# Patient Record
Sex: Female | Born: 1960 | Race: White | Hispanic: No | Smoking: Never smoker
Health system: Southern US, Community
[De-identification: ages and names within clinical notes are randomized; demographics above are authoritative.]

## PROBLEM LIST (undated history)

## (undated) DIAGNOSIS — F32A Depression, unspecified: Secondary | ICD-10-CM

## (undated) DIAGNOSIS — E611 Iron deficiency: Secondary | ICD-10-CM

## (undated) DIAGNOSIS — D649 Anemia, unspecified: Secondary | ICD-10-CM

## (undated) DIAGNOSIS — F329 Major depressive disorder, single episode, unspecified: Secondary | ICD-10-CM

## (undated) HISTORY — DX: Depression, unspecified: F32.A

## (undated) HISTORY — DX: Iron deficiency: E61.1

## (undated) HISTORY — DX: Anemia, unspecified: D64.9

## (undated) HISTORY — DX: Major depressive disorder, single episode, unspecified: F32.9

---
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
## 1995-10-30 HISTORY — PX: TUBAL LIGATION: SHX77

## 1998-12-15 ENCOUNTER — Encounter: Payer: Self-pay | Admitting: Gastroenterology

## 1998-12-15 ENCOUNTER — Ambulatory Visit (HOSPITAL_COMMUNITY): Admission: RE | Admit: 1998-12-15 | Discharge: 1998-12-15 | Payer: Self-pay | Admitting: Gastroenterology

## 1999-09-14 ENCOUNTER — Other Ambulatory Visit: Admission: RE | Admit: 1999-09-14 | Discharge: 1999-09-14 | Payer: Self-pay | Admitting: Obstetrics and Gynecology

## 2002-02-20 ENCOUNTER — Encounter: Admission: RE | Admit: 2002-02-20 | Discharge: 2002-02-20 | Payer: Self-pay | Admitting: Gynecology

## 2002-02-20 ENCOUNTER — Encounter: Payer: Self-pay | Admitting: Gynecology

## 2002-02-20 ENCOUNTER — Other Ambulatory Visit: Admission: RE | Admit: 2002-02-20 | Discharge: 2002-02-20 | Payer: Self-pay | Admitting: Gynecology

## 2002-04-07 ENCOUNTER — Encounter (HOSPITAL_COMMUNITY): Admission: RE | Admit: 2002-04-07 | Discharge: 2002-07-06 | Payer: Self-pay | Admitting: Oncology

## 2003-03-30 ENCOUNTER — Other Ambulatory Visit: Admission: RE | Admit: 2003-03-30 | Discharge: 2003-03-30 | Payer: Self-pay | Admitting: Gynecology

## 2004-11-09 ENCOUNTER — Other Ambulatory Visit: Admission: RE | Admit: 2004-11-09 | Discharge: 2004-11-09 | Payer: Self-pay | Admitting: Gynecology

## 2004-11-22 ENCOUNTER — Encounter: Admission: RE | Admit: 2004-11-22 | Discharge: 2004-11-22 | Payer: Self-pay | Admitting: Gynecology

## 2005-01-15 ENCOUNTER — Ambulatory Visit: Payer: Self-pay | Admitting: Oncology

## 2005-01-18 ENCOUNTER — Encounter (HOSPITAL_COMMUNITY): Admission: RE | Admit: 2005-01-18 | Discharge: 2005-04-18 | Payer: Self-pay | Admitting: Oncology

## 2005-04-27 ENCOUNTER — Ambulatory Visit: Payer: Self-pay | Admitting: Oncology

## 2005-04-30 ENCOUNTER — Encounter (HOSPITAL_COMMUNITY): Admission: RE | Admit: 2005-04-30 | Discharge: 2005-07-29 | Payer: Self-pay | Admitting: Oncology

## 2005-08-17 ENCOUNTER — Ambulatory Visit: Payer: Self-pay | Admitting: Oncology

## 2005-11-15 ENCOUNTER — Other Ambulatory Visit: Admission: RE | Admit: 2005-11-15 | Discharge: 2005-11-15 | Payer: Self-pay | Admitting: Gynecology

## 2005-12-27 ENCOUNTER — Ambulatory Visit: Payer: Self-pay | Admitting: Oncology

## 2005-12-28 ENCOUNTER — Encounter: Admission: RE | Admit: 2005-12-28 | Discharge: 2005-12-28 | Payer: Self-pay | Admitting: Gynecology

## 2006-02-11 ENCOUNTER — Encounter (HOSPITAL_COMMUNITY): Admission: RE | Admit: 2006-02-11 | Discharge: 2006-05-12 | Payer: Self-pay | Admitting: Oncology

## 2006-03-26 ENCOUNTER — Ambulatory Visit: Payer: Self-pay | Admitting: Oncology

## 2006-04-01 LAB — COMPREHENSIVE METABOLIC PANEL
AST: 13 U/L (ref 0–37)
BUN: 9 mg/dL (ref 6–23)
Calcium: 8.6 mg/dL (ref 8.4–10.5)
Chloride: 105 mEq/L (ref 96–112)
Creatinine, Ser: 0.89 mg/dL (ref 0.40–1.20)
Glucose, Bld: 80 mg/dL (ref 70–99)

## 2006-04-01 LAB — CBC & DIFF AND RETIC
Basophils Absolute: 0 10*3/uL (ref 0.0–0.1)
EOS%: 4 % (ref 0.0–7.0)
MCH: 29.6 pg (ref 26.0–34.0)
MCV: 86.3 fL (ref 81.0–101.0)
MONO%: 6.5 % (ref 0.0–13.0)
RBC: 4.57 10*6/uL (ref 3.70–5.32)
RDW: 17.6 % — ABNORMAL HIGH (ref 11.3–14.5)
RETIC #: 92.8 10*3/uL (ref 19.7–115.1)
Retic %: 2 % (ref 0.4–2.3)

## 2006-05-08 ENCOUNTER — Ambulatory Visit: Payer: Self-pay | Admitting: Oncology

## 2006-05-16 LAB — CBC & DIFF AND RETIC
BASO%: 0.6 % (ref 0.0–2.0)
EOS%: 3.1 % (ref 0.0–7.0)
MCH: 30.1 pg (ref 26.0–34.0)
MCHC: 34.2 g/dL (ref 32.0–36.0)
MCV: 87.9 fL (ref 81.0–101.0)
MONO%: 7.9 % (ref 0.0–13.0)
RBC: 4.6 10*6/uL (ref 3.70–5.32)
RDW: 14.6 % — ABNORMAL HIGH (ref 11.3–14.5)
RETIC #: 86 10*3/uL (ref 19.7–115.1)
Retic %: 1.9 % (ref 0.4–2.3)

## 2006-05-16 LAB — COMPREHENSIVE METABOLIC PANEL WITH GFR
ALT: 11 U/L (ref 0–40)
AST: 12 U/L (ref 0–37)
Albumin: 4.2 g/dL (ref 3.5–5.2)
Alkaline Phosphatase: 74 U/L (ref 39–117)
BUN: 9 mg/dL (ref 6–23)
CO2: 26 meq/L (ref 19–32)
Calcium: 8.9 mg/dL (ref 8.4–10.5)
Chloride: 102 meq/L (ref 96–112)
Creatinine, Ser: 0.75 mg/dL (ref 0.40–1.20)
Glucose, Bld: 82 mg/dL (ref 70–99)
Potassium: 4 meq/L (ref 3.5–5.3)
Sodium: 136 meq/L (ref 135–145)
Total Bilirubin: 0.4 mg/dL (ref 0.3–1.2)
Total Protein: 7.2 g/dL (ref 6.0–8.3)

## 2006-08-14 ENCOUNTER — Ambulatory Visit: Payer: Self-pay | Admitting: Oncology

## 2006-09-20 ENCOUNTER — Encounter (HOSPITAL_COMMUNITY): Admission: RE | Admit: 2006-09-20 | Discharge: 2006-09-20 | Payer: Self-pay | Admitting: Oncology

## 2006-11-06 ENCOUNTER — Ambulatory Visit: Payer: Self-pay | Admitting: Oncology

## 2006-11-11 LAB — CBC & DIFF AND RETIC
Basophils Absolute: 0 10*3/uL (ref 0.0–0.1)
Eosinophils Absolute: 0.2 10*3/uL (ref 0.0–0.5)
HGB: 13.2 g/dL (ref 11.6–15.9)
IRF: 0.28 (ref 0.130–0.330)
MCV: 86.1 fL (ref 81.0–101.0)
MONO#: 0.5 10*3/uL (ref 0.1–0.9)
MONO%: 8.1 % (ref 0.0–13.0)
NEUT#: 3.1 10*3/uL (ref 1.5–6.5)
RDW: 16.4 % — ABNORMAL HIGH (ref 11.3–14.5)
RETIC #: 77.9 10*3/uL (ref 19.7–115.1)
Retic %: 1.8 % (ref 0.4–2.3)
WBC: 5.8 10*3/uL (ref 3.9–10.0)
lymph#: 2 10*3/uL (ref 0.9–3.3)

## 2006-11-11 LAB — COMPREHENSIVE METABOLIC PANEL
ALT: 15 U/L (ref 0–35)
AST: 16 U/L (ref 0–37)
Albumin: 4 g/dL (ref 3.5–5.2)
Alkaline Phosphatase: 69 U/L (ref 39–117)
Calcium: 8.9 mg/dL (ref 8.4–10.5)
Chloride: 107 mEq/L (ref 96–112)
Potassium: 3.6 mEq/L (ref 3.5–5.3)
Sodium: 140 mEq/L (ref 135–145)
Total Protein: 6.5 g/dL (ref 6.0–8.3)

## 2007-01-14 ENCOUNTER — Encounter: Admission: RE | Admit: 2007-01-14 | Discharge: 2007-01-14 | Payer: Self-pay | Admitting: Family Medicine

## 2007-02-12 ENCOUNTER — Ambulatory Visit: Payer: Self-pay | Admitting: Oncology

## 2007-05-15 ENCOUNTER — Ambulatory Visit: Payer: Self-pay | Admitting: Oncology

## 2007-05-19 LAB — CBC WITH DIFFERENTIAL/PLATELET
BASO%: 0.4 % (ref 0.0–2.0)
Basophils Absolute: 0 10*3/uL (ref 0.0–0.1)
EOS%: 3.6 % (ref 0.0–7.0)
HCT: 33.3 % — ABNORMAL LOW (ref 34.8–46.6)
HGB: 11.5 g/dL — ABNORMAL LOW (ref 11.6–15.9)
LYMPH%: 31.4 % (ref 14.0–48.0)
MCH: 28 pg (ref 26.0–34.0)
MCHC: 34.4 g/dL (ref 32.0–36.0)
MCV: 81.4 fL (ref 81.0–101.0)
MONO%: 7.4 % (ref 0.0–13.0)
NEUT%: 57.2 % (ref 39.6–76.8)

## 2007-08-14 ENCOUNTER — Ambulatory Visit: Payer: Self-pay | Admitting: Oncology

## 2007-08-18 LAB — CBC WITH DIFFERENTIAL/PLATELET
BASO%: 0.5 % (ref 0.0–2.0)
Basophils Absolute: 0 10*3/uL (ref 0.0–0.1)
EOS%: 3.6 % (ref 0.0–7.0)
MCH: 25.8 pg — ABNORMAL LOW (ref 26.0–34.0)
MCHC: 33.9 g/dL (ref 32.0–36.0)
MCV: 76.2 fL — ABNORMAL LOW (ref 81.0–101.0)
MONO%: 7.5 % (ref 0.0–13.0)
RBC: 4.18 10*6/uL (ref 3.70–5.32)
RDW: 15.1 % — ABNORMAL HIGH (ref 11.3–14.5)

## 2007-11-13 ENCOUNTER — Ambulatory Visit: Payer: Self-pay | Admitting: Oncology

## 2007-11-17 LAB — CBC WITH DIFFERENTIAL/PLATELET
Eosinophils Absolute: 0.2 10*3/uL (ref 0.0–0.5)
MCV: 71.3 fL — ABNORMAL LOW (ref 81.0–101.0)
MONO%: 7.4 % (ref 0.0–13.0)
NEUT#: 2.4 10*3/uL (ref 1.5–6.5)
RBC: 4.21 10*6/uL (ref 3.70–5.32)
RDW: 16.2 % — ABNORMAL HIGH (ref 11.3–14.5)
WBC: 4.8 10*3/uL (ref 3.9–10.0)
lymph#: 1.8 10*3/uL (ref 0.9–3.3)

## 2007-11-24 LAB — FERRITIN: Ferritin: 3 ng/mL — ABNORMAL LOW (ref 10–291)

## 2008-02-23 ENCOUNTER — Ambulatory Visit: Payer: Self-pay | Admitting: Oncology

## 2008-08-12 ENCOUNTER — Ambulatory Visit: Payer: Self-pay | Admitting: Oncology

## 2008-11-11 ENCOUNTER — Ambulatory Visit: Payer: Self-pay | Admitting: Oncology

## 2009-04-26 ENCOUNTER — Ambulatory Visit: Payer: Self-pay | Admitting: Oncology

## 2009-04-28 ENCOUNTER — Ambulatory Visit: Payer: Self-pay | Admitting: Women's Health

## 2009-04-28 ENCOUNTER — Other Ambulatory Visit: Admission: RE | Admit: 2009-04-28 | Discharge: 2009-04-28 | Payer: Self-pay | Admitting: Obstetrics and Gynecology

## 2009-04-28 ENCOUNTER — Encounter: Payer: Self-pay | Admitting: Women's Health

## 2009-04-28 LAB — CBC WITH DIFFERENTIAL/PLATELET
BASO%: 0.3 % (ref 0.0–2.0)
EOS%: 6.1 % (ref 0.0–7.0)
HCT: 26.4 % — ABNORMAL LOW (ref 34.8–46.6)
LYMPH%: 35.3 % (ref 14.0–49.7)
MCH: 22 pg — ABNORMAL LOW (ref 25.1–34.0)
MCHC: 32.4 g/dL (ref 31.5–36.0)
NEUT%: 51.9 % (ref 38.4–76.8)
Platelets: 240 10*3/uL (ref 145–400)
RBC: 3.89 10*6/uL (ref 3.70–5.45)
lymph#: 2.1 10*3/uL (ref 0.9–3.3)

## 2009-06-13 ENCOUNTER — Ambulatory Visit: Payer: Self-pay | Admitting: Obstetrics and Gynecology

## 2009-06-20 ENCOUNTER — Ambulatory Visit: Payer: Self-pay | Admitting: Oncology

## 2009-06-22 LAB — CBC WITH DIFFERENTIAL/PLATELET
EOS%: 5 % (ref 0.0–7.0)
Eosinophils Absolute: 0.3 10*3/uL (ref 0.0–0.5)
LYMPH%: 35.3 % (ref 14.0–49.7)
MCH: 24.2 pg — ABNORMAL LOW (ref 25.1–34.0)
MCV: 75.1 fL — ABNORMAL LOW (ref 79.5–101.0)
MONO%: 7.3 % (ref 0.0–14.0)
NEUT#: 3 10*3/uL (ref 1.5–6.5)
Platelets: 290 10*3/uL (ref 145–400)
RBC: 3.57 10*6/uL — ABNORMAL LOW (ref 3.70–5.45)

## 2009-06-22 LAB — FERRITIN: Ferritin: 12 ng/mL (ref 10–291)

## 2009-07-25 ENCOUNTER — Ambulatory Visit: Payer: Self-pay | Admitting: Oncology

## 2009-08-15 LAB — CBC WITH DIFFERENTIAL/PLATELET
BASO%: 0.8 % (ref 0.0–2.0)
Basophils Absolute: 0 10*3/uL (ref 0.0–0.1)
EOS%: 4.7 % (ref 0.0–7.0)
HCT: 34 % — ABNORMAL LOW (ref 34.8–46.6)
HGB: 11.4 g/dL — ABNORMAL LOW (ref 11.6–15.9)
LYMPH%: 31 % (ref 14.0–49.7)
MCH: 26.3 pg (ref 25.1–34.0)
MCHC: 33.4 g/dL (ref 31.5–36.0)
MCV: 78.6 fL — ABNORMAL LOW (ref 79.5–101.0)
MONO%: 7.8 % (ref 0.0–14.0)
NEUT%: 55.7 % (ref 38.4–76.8)
Platelets: 261 10*3/uL (ref 145–400)
lymph#: 1.8 10*3/uL (ref 0.9–3.3)

## 2009-11-24 ENCOUNTER — Ambulatory Visit: Payer: Self-pay | Admitting: Oncology

## 2009-11-24 LAB — CBC WITH DIFFERENTIAL/PLATELET
Eosinophils Absolute: 0.4 10*3/uL (ref 0.0–0.5)
HCT: 34.5 % — ABNORMAL LOW (ref 34.8–46.6)
LYMPH%: 31.5 % (ref 14.0–49.7)
MCHC: 33.1 g/dL (ref 31.5–36.0)
MONO#: 0.6 10*3/uL (ref 0.1–0.9)
NEUT#: 4.1 10*3/uL (ref 1.5–6.5)
NEUT%: 55.1 % (ref 38.4–76.8)
Platelets: 240 10*3/uL (ref 145–400)
WBC: 7.4 10*3/uL (ref 3.9–10.3)

## 2010-02-22 ENCOUNTER — Ambulatory Visit: Payer: Self-pay | Admitting: Oncology

## 2010-02-22 LAB — CBC WITH DIFFERENTIAL/PLATELET
BASO%: 0.4 % (ref 0.0–2.0)
Basophils Absolute: 0 10*3/uL (ref 0.0–0.1)
EOS%: 6.3 % (ref 0.0–7.0)
Eosinophils Absolute: 0.4 10*3/uL (ref 0.0–0.5)
HCT: 36.9 % (ref 34.8–46.6)
HGB: 12.3 g/dL (ref 11.6–15.9)
LYMPH%: 36.5 % (ref 14.0–49.7)
MCH: 28.3 pg (ref 25.1–34.0)
MCHC: 33.3 g/dL (ref 31.5–36.0)
MCV: 84.8 fL (ref 79.5–101.0)
MONO#: 0.3 10*3/uL (ref 0.1–0.9)
MONO%: 4.9 % (ref 0.0–14.0)
NEUT#: 3.5 10*3/uL (ref 1.5–6.5)
NEUT%: 51.9 % (ref 38.4–76.8)
Platelets: 216 10*3/uL (ref 145–400)
RBC: 4.35 10*6/uL (ref 3.70–5.45)
RDW: 17 % — ABNORMAL HIGH (ref 11.2–14.5)
WBC: 6.8 10*3/uL (ref 3.9–10.3)
lymph#: 2.5 10*3/uL (ref 0.9–3.3)

## 2010-02-22 LAB — FERRITIN: Ferritin: 23 ng/mL (ref 10–291)

## 2010-05-10 ENCOUNTER — Ambulatory Visit: Payer: Self-pay | Admitting: Obstetrics and Gynecology

## 2010-05-15 ENCOUNTER — Ambulatory Visit: Payer: Self-pay | Admitting: Oncology

## 2010-05-18 ENCOUNTER — Encounter: Admission: RE | Admit: 2010-05-18 | Discharge: 2010-05-18 | Payer: Self-pay | Admitting: Obstetrics and Gynecology

## 2010-05-22 LAB — CBC WITH DIFFERENTIAL/PLATELET
Eosinophils Absolute: 0.3 10*3/uL (ref 0.0–0.5)
LYMPH%: 32.7 % (ref 14.0–49.7)
MCHC: 33.3 g/dL (ref 31.5–36.0)
MCV: 82.2 fL (ref 79.5–101.0)
MONO%: 8 % (ref 0.0–14.0)
NEUT%: 54.7 % (ref 38.4–76.8)
Platelets: 282 10*3/uL (ref 145–400)
RBC: 4.15 10*6/uL (ref 3.70–5.45)

## 2010-05-22 LAB — FERRITIN: Ferritin: 7 ng/mL — ABNORMAL LOW (ref 10–291)

## 2010-06-22 ENCOUNTER — Ambulatory Visit: Payer: Self-pay | Admitting: Oncology

## 2010-07-18 LAB — CBC WITH DIFFERENTIAL/PLATELET
Basophils Absolute: 0 10*3/uL (ref 0.0–0.1)
EOS%: 5.7 % (ref 0.0–7.0)
HCT: 34.8 % (ref 34.8–46.6)
HGB: 11.7 g/dL (ref 11.6–15.9)
MCH: 27.7 pg (ref 25.1–34.0)
MCV: 82.5 fL (ref 79.5–101.0)
MONO%: 7.4 % (ref 0.0–14.0)
NEUT%: 48.6 % (ref 38.4–76.8)
Platelets: 236 10*3/uL (ref 145–400)

## 2010-08-03 ENCOUNTER — Other Ambulatory Visit: Admission: RE | Admit: 2010-08-03 | Discharge: 2010-08-03 | Payer: Self-pay | Admitting: Obstetrics and Gynecology

## 2010-08-03 ENCOUNTER — Ambulatory Visit: Payer: Self-pay | Admitting: Women's Health

## 2010-09-14 ENCOUNTER — Ambulatory Visit: Payer: Self-pay | Admitting: Women's Health

## 2010-11-20 ENCOUNTER — Encounter: Payer: Self-pay | Admitting: Gynecology

## 2011-01-15 ENCOUNTER — Other Ambulatory Visit (HOSPITAL_COMMUNITY): Payer: Self-pay | Admitting: Orthopedic Surgery

## 2011-01-15 DIAGNOSIS — M25562 Pain in left knee: Secondary | ICD-10-CM

## 2011-01-17 ENCOUNTER — Other Ambulatory Visit: Payer: Self-pay | Admitting: Oncology

## 2011-01-17 ENCOUNTER — Encounter (HOSPITAL_BASED_OUTPATIENT_CLINIC_OR_DEPARTMENT_OTHER): Payer: 59 | Admitting: Oncology

## 2011-01-17 DIAGNOSIS — N920 Excessive and frequent menstruation with regular cycle: Secondary | ICD-10-CM

## 2011-01-17 DIAGNOSIS — D500 Iron deficiency anemia secondary to blood loss (chronic): Secondary | ICD-10-CM

## 2011-01-17 DIAGNOSIS — D509 Iron deficiency anemia, unspecified: Secondary | ICD-10-CM

## 2011-01-17 LAB — CBC WITH DIFFERENTIAL/PLATELET
BASO%: 0.3 % (ref 0.0–2.0)
EOS%: 8.3 % — ABNORMAL HIGH (ref 0.0–7.0)
LYMPH%: 36.3 % (ref 14.0–49.7)
MCH: 29.2 pg (ref 25.1–34.0)
MCHC: 34.2 g/dL (ref 31.5–36.0)
MCV: 85.3 fL (ref 79.5–101.0)
MONO%: 8 % (ref 0.0–14.0)
NEUT#: 2.8 10*3/uL (ref 1.5–6.5)
Platelets: 222 10*3/uL (ref 145–400)
RBC: 4.27 10*6/uL (ref 3.70–5.45)
RDW: 14 % (ref 11.2–14.5)

## 2011-01-17 LAB — FERRITIN: Ferritin: 9 ng/mL — ABNORMAL LOW (ref 10–291)

## 2011-01-18 ENCOUNTER — Ambulatory Visit (HOSPITAL_COMMUNITY)
Admission: RE | Admit: 2011-01-18 | Discharge: 2011-01-18 | Disposition: A | Discharge status: Home / Self Care | Payer: 59 | Source: Ambulatory Visit | Attending: Orthopedic Surgery | Admitting: Orthopedic Surgery

## 2011-01-18 DIAGNOSIS — M2240 Chondromalacia patellae, unspecified knee: Secondary | ICD-10-CM | POA: Insufficient documentation

## 2011-01-18 DIAGNOSIS — M25569 Pain in unspecified knee: Secondary | ICD-10-CM | POA: Insufficient documentation

## 2011-01-18 DIAGNOSIS — M2390 Unspecified internal derangement of unspecified knee: Secondary | ICD-10-CM | POA: Insufficient documentation

## 2011-01-18 DIAGNOSIS — M9420 Chondromalacia, unspecified site: Secondary | ICD-10-CM | POA: Insufficient documentation

## 2011-01-18 DIAGNOSIS — M25469 Effusion, unspecified knee: Secondary | ICD-10-CM | POA: Insufficient documentation

## 2011-01-19 ENCOUNTER — Inpatient Hospital Stay (HOSPITAL_COMMUNITY): Admission: RE | Admit: 2011-01-19 | Payer: Self-pay | Source: Ambulatory Visit

## 2011-01-25 ENCOUNTER — Encounter (HOSPITAL_BASED_OUTPATIENT_CLINIC_OR_DEPARTMENT_OTHER): Payer: 59 | Admitting: Oncology

## 2011-01-25 DIAGNOSIS — D500 Iron deficiency anemia secondary to blood loss (chronic): Secondary | ICD-10-CM

## 2011-01-25 DIAGNOSIS — N920 Excessive and frequent menstruation with regular cycle: Secondary | ICD-10-CM

## 2011-02-01 ENCOUNTER — Encounter (HOSPITAL_BASED_OUTPATIENT_CLINIC_OR_DEPARTMENT_OTHER): Payer: 59 | Admitting: Oncology

## 2011-02-01 DIAGNOSIS — D500 Iron deficiency anemia secondary to blood loss (chronic): Secondary | ICD-10-CM

## 2011-02-01 DIAGNOSIS — N920 Excessive and frequent menstruation with regular cycle: Secondary | ICD-10-CM

## 2011-02-21 ENCOUNTER — Encounter (HOSPITAL_BASED_OUTPATIENT_CLINIC_OR_DEPARTMENT_OTHER): Payer: 59 | Admitting: Oncology

## 2011-02-21 ENCOUNTER — Other Ambulatory Visit: Payer: Self-pay | Admitting: Oncology

## 2011-02-21 DIAGNOSIS — D500 Iron deficiency anemia secondary to blood loss (chronic): Secondary | ICD-10-CM

## 2011-02-21 DIAGNOSIS — D509 Iron deficiency anemia, unspecified: Secondary | ICD-10-CM

## 2011-02-21 DIAGNOSIS — N920 Excessive and frequent menstruation with regular cycle: Secondary | ICD-10-CM

## 2011-02-21 LAB — CBC & DIFF AND RETIC
BASO%: 0.3 % (ref 0.0–2.0)
EOS%: 4.7 % (ref 0.0–7.0)
Immature Retic Fract: 6.4 % (ref 0.00–10.70)
MCH: 29 pg (ref 25.1–34.0)
MCHC: 33.2 g/dL (ref 31.5–36.0)
MONO#: 0.4 10*3/uL (ref 0.1–0.9)
NEUT%: 53 % (ref 38.4–76.8)
RBC: 4.51 10*6/uL (ref 3.70–5.45)
Retic %: 2.17 % — ABNORMAL HIGH (ref 0.50–1.50)
WBC: 5.7 10*3/uL (ref 3.9–10.3)
lymph#: 2 10*3/uL (ref 0.9–3.3)

## 2011-02-21 LAB — FERRITIN: Ferritin: 173 ng/mL (ref 10–291)

## 2011-04-20 ENCOUNTER — Other Ambulatory Visit: Payer: Self-pay | Admitting: Obstetrics and Gynecology

## 2011-04-20 DIAGNOSIS — Z1231 Encounter for screening mammogram for malignant neoplasm of breast: Secondary | ICD-10-CM

## 2011-04-25 ENCOUNTER — Ambulatory Visit (HOSPITAL_BASED_OUTPATIENT_CLINIC_OR_DEPARTMENT_OTHER)
Admission: RE | Admit: 2011-04-25 | Discharge: 2011-04-25 | Disposition: A | Discharge status: Home / Self Care | Payer: 59 | Source: Ambulatory Visit | Attending: Orthopedic Surgery | Admitting: Orthopedic Surgery

## 2011-04-25 DIAGNOSIS — IMO0002 Reserved for concepts with insufficient information to code with codable children: Secondary | ICD-10-CM | POA: Insufficient documentation

## 2011-04-25 DIAGNOSIS — Z01812 Encounter for preprocedural laboratory examination: Secondary | ICD-10-CM | POA: Insufficient documentation

## 2011-04-25 DIAGNOSIS — Z79899 Other long term (current) drug therapy: Secondary | ICD-10-CM | POA: Insufficient documentation

## 2011-04-25 DIAGNOSIS — X58XXXA Exposure to other specified factors, initial encounter: Secondary | ICD-10-CM | POA: Insufficient documentation

## 2011-04-25 DIAGNOSIS — E669 Obesity, unspecified: Secondary | ICD-10-CM | POA: Insufficient documentation

## 2011-04-25 DIAGNOSIS — M9420 Chondromalacia, unspecified site: Secondary | ICD-10-CM | POA: Insufficient documentation

## 2011-04-25 DIAGNOSIS — D509 Iron deficiency anemia, unspecified: Secondary | ICD-10-CM | POA: Insufficient documentation

## 2011-04-25 LAB — CBC
HCT: 40.7 % (ref 36.0–46.0)
Hemoglobin: 13.6 g/dL (ref 12.0–15.0)
MCH: 29.1 pg (ref 26.0–34.0)
MCHC: 33.4 g/dL (ref 30.0–36.0)
RDW: 13.7 % (ref 11.5–15.5)

## 2011-04-27 HISTORY — PX: KNEE ARTHROSCOPY: SHX127

## 2011-05-09 NOTE — Op Note (Addendum)
NAMEMIYEKO, MAHLUM             ACCOUNT NO.:  [id number]  MEDICAL RECORD NO.:  [id number]  LOCATION:  MRI                          FACILITY:  [facility]  PHYSICIAN:  Ollen Gross, M.D.    DATE OF BIRTH:  [birth year]  DATE OF PROCEDURE:  04/25/2011 DATE OF DISCHARGE:  01/18/2011                              OPERATIVE REPORT   PREOPERATIVE DIAGNOSIS:  Left knee medial meniscal tear.  POSTOPERATIVE DIAGNOSES:  Left knee medial meniscal tear, plus osteochondral defect of medial femoral condyle.  PROCEDURE:  Left knee arthroscopy with meniscal debridement and chondroplasty.  SURGEON:  Ollen Gross, M.D.  ASSISTANT.:  None.  ANESTHESIA:  General.  ESTIMATED BLOOD LOSS:  Minimal.  DRAINS:  None.  COMPLICATIONS:  None.  CONDITION.:  Stable to recovery.  BRIEF CLINICAL NOTE:  Tara Duffy is a 50 year old female who has had a several-month history of significant left knee pain, recurrent effusions and mechanical symptoms.  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  Exam and history suggested medial meniscal tear which was suggested by MRI.  She presents now for arthroscopy and debridement.  PROCEDURE IN DETAIL:  After successful administration of general anesthetic, a tourniquet was placed on her left thigh and left lower extremity prepped and draped in the usual sterile fashion.  Standard superomedial and inferolateral incisions were made, inflow cannula passed, superomedial camera passed inferolateral.  Arthroscopic visualization proceeds.  Undersurface of patella has exposed bone as does the trochlea, especially laterally.  Medial and lateral gutters were visualized, there were no loose bodies.  Flexion valgus force was applied to the knee and the medial compartment was entered.  There was about 1 x 2 cm osteochondral defect in the medial femoral condyle at the central most portion of it.  This space is unstable.   There is also a medial meniscal tear in the posterior horn.  A spinal needle was used to localize the inferomedial portal, a small incision made and dilator placed.  The meniscus was then debrided back to stable base with baskets and 4.2 mm shaver and sealed off with the ArthroCare.  I debrided the osteochondral defect with the shaver.  I debrided back to bone and abraded the bone.  That actual defect was less than 1 x 1 cm but the overall chondral defect was about another 1 x 2 cm.  The rest of medial femoral condyle looked fairly normal.  The medial tibial plateau was normal.  Intercondylar notch was visualized.  The ACL was intact. Lateral compartment was entered and it looked normal.  Patellofemoral compartment was again addressed revealing the exposed bone.  There was a little unstable cartilage in the trochlea which I debrided back to stable base.  Joints again inspected and no other tears, loose bodies or defects were noted.  The arthroscopic equipment were removed from inferior portals which were closed with interrupted 4-0 nylon.  A 20 cc of 0.25% Marcaine with epi injected through the inflow cannula and that was removed and that portal closed with nylon.  Incision were cleaned and dried and a bulky sterile dressing applied.  She was then awakened and transported to recovery room  in stable condition.     Ollen Gross, M.D.     FA/MEDQ  D:  04/25/2011  T:  04/25/2011  Job:  [id number]  Electronically Signed by Ollen Gross M.D. on 05/09/2011 12:31:48 PM

## 2011-05-10 ENCOUNTER — Other Ambulatory Visit: Payer: Self-pay | Admitting: Oncology

## 2011-05-10 ENCOUNTER — Encounter (HOSPITAL_BASED_OUTPATIENT_CLINIC_OR_DEPARTMENT_OTHER): Payer: 59 | Admitting: Oncology

## 2011-05-10 DIAGNOSIS — N920 Excessive and frequent menstruation with regular cycle: Secondary | ICD-10-CM

## 2011-05-10 DIAGNOSIS — D509 Iron deficiency anemia, unspecified: Secondary | ICD-10-CM

## 2011-05-10 DIAGNOSIS — N6310 Unspecified lump in the right breast, unspecified quadrant: Secondary | ICD-10-CM

## 2011-05-10 DIAGNOSIS — D500 Iron deficiency anemia secondary to blood loss (chronic): Secondary | ICD-10-CM

## 2011-05-10 LAB — CBC & DIFF AND RETIC
BASO%: 0.4 % (ref 0.0–2.0)
EOS%: 5.2 % (ref 0.0–7.0)
LYMPH%: 40.3 % (ref 14.0–49.7)
MCHC: 34.3 g/dL (ref 31.5–36.0)
MCV: 86.6 fL (ref 79.5–101.0)
MONO%: 6.7 % (ref 0.0–14.0)
NEUT#: 3.2 10*3/uL (ref 1.5–6.5)
Platelets: 230 10*3/uL (ref 145–400)
RBC: 4.04 10*6/uL (ref 3.70–5.45)
RDW: 13.4 % (ref 11.2–14.5)
Retic %: 1.28 % (ref 0.50–1.50)
nRBC: 0 % (ref 0–0)

## 2011-05-21 ENCOUNTER — Ambulatory Visit
Admission: RE | Admit: 2011-05-21 | Discharge: 2011-05-21 | Disposition: A | Discharge status: Home / Self Care | Payer: 59 | Source: Ambulatory Visit | Attending: Oncology | Admitting: Oncology

## 2011-05-21 ENCOUNTER — Ambulatory Visit: Payer: 59

## 2011-05-21 DIAGNOSIS — N6310 Unspecified lump in the right breast, unspecified quadrant: Secondary | ICD-10-CM

## 2011-05-22 ENCOUNTER — Telehealth: Payer: Self-pay | Admitting: Women's Health

## 2011-05-23 NOTE — Telephone Encounter (Signed)
Telephone call to [facility] left  message on her cell per her request. Call was made in regard to her mammogram report. Reviewed her mammogram report from 7-11 2 her current mammogram report. Did review it appears stable. Encouraged to continue SBE report any changes.

## 2011-06-28 ENCOUNTER — Encounter (HOSPITAL_BASED_OUTPATIENT_CLINIC_OR_DEPARTMENT_OTHER): Payer: 59 | Admitting: Oncology

## 2011-06-28 ENCOUNTER — Other Ambulatory Visit: Payer: Self-pay | Admitting: Oncology

## 2011-06-28 DIAGNOSIS — D509 Iron deficiency anemia, unspecified: Secondary | ICD-10-CM

## 2011-06-28 DIAGNOSIS — N920 Excessive and frequent menstruation with regular cycle: Secondary | ICD-10-CM

## 2011-06-28 DIAGNOSIS — D500 Iron deficiency anemia secondary to blood loss (chronic): Secondary | ICD-10-CM

## 2011-06-28 LAB — CBC & DIFF AND RETIC
Basophils Absolute: 0 10*3/uL (ref 0.0–0.1)
EOS%: 4.6 % (ref 0.0–7.0)
Eosinophils Absolute: 0.3 10*3/uL (ref 0.0–0.5)
HGB: 12.3 g/dL (ref 11.6–15.9)
Immature Retic Fract: 12.1 % — ABNORMAL HIGH (ref 1.60–10.00)
MCH: 28.7 pg (ref 25.1–34.0)
NEUT#: 3.1 10*3/uL (ref 1.5–6.5)
RDW: 13.3 % (ref 11.2–14.5)
Retic Ct Abs: 82.8 10*3/uL (ref 33.70–90.70)
lymph#: 2.5 10*3/uL (ref 0.9–3.3)

## 2011-07-11 ENCOUNTER — Encounter (HOSPITAL_BASED_OUTPATIENT_CLINIC_OR_DEPARTMENT_OTHER): Payer: 59 | Admitting: Oncology

## 2011-07-11 DIAGNOSIS — D509 Iron deficiency anemia, unspecified: Secondary | ICD-10-CM

## 2011-07-11 DIAGNOSIS — N920 Excessive and frequent menstruation with regular cycle: Secondary | ICD-10-CM

## 2011-07-19 ENCOUNTER — Encounter (HOSPITAL_BASED_OUTPATIENT_CLINIC_OR_DEPARTMENT_OTHER): Payer: 59 | Admitting: Oncology

## 2011-07-19 DIAGNOSIS — D509 Iron deficiency anemia, unspecified: Secondary | ICD-10-CM

## 2011-07-19 DIAGNOSIS — N920 Excessive and frequent menstruation with regular cycle: Secondary | ICD-10-CM

## 2011-08-09 DIAGNOSIS — F329 Major depressive disorder, single episode, unspecified: Secondary | ICD-10-CM | POA: Insufficient documentation

## 2011-08-09 DIAGNOSIS — D649 Anemia, unspecified: Secondary | ICD-10-CM | POA: Insufficient documentation

## 2011-08-09 DIAGNOSIS — F32A Depression, unspecified: Secondary | ICD-10-CM | POA: Insufficient documentation

## 2011-08-09 DIAGNOSIS — K829 Disease of gallbladder, unspecified: Secondary | ICD-10-CM | POA: Insufficient documentation

## 2011-08-10 ENCOUNTER — Other Ambulatory Visit: Payer: Self-pay | Admitting: Women's Health

## 2011-08-15 ENCOUNTER — Encounter: Payer: Self-pay | Admitting: Women's Health

## 2011-08-15 ENCOUNTER — Encounter (HOSPITAL_BASED_OUTPATIENT_CLINIC_OR_DEPARTMENT_OTHER): Payer: 59 | Admitting: Oncology

## 2011-08-15 ENCOUNTER — Ambulatory Visit (INDEPENDENT_AMBULATORY_CARE_PROVIDER_SITE_OTHER): Payer: 59 | Admitting: Women's Health

## 2011-08-15 ENCOUNTER — Other Ambulatory Visit: Payer: Self-pay | Admitting: Oncology

## 2011-08-15 ENCOUNTER — Other Ambulatory Visit (HOSPITAL_COMMUNITY)
Admission: RE | Admit: 2011-08-15 | Discharge: 2011-08-15 | Disposition: A | Discharge status: Home / Self Care | Payer: 59 | Source: Ambulatory Visit | Attending: Women's Health | Admitting: Women's Health

## 2011-08-15 VITALS — BP 130/70 | Ht 67.5 in | Wt 263.0 lb

## 2011-08-15 DIAGNOSIS — N920 Excessive and frequent menstruation with regular cycle: Secondary | ICD-10-CM

## 2011-08-15 DIAGNOSIS — Z01419 Encounter for gynecological examination (general) (routine) without abnormal findings: Secondary | ICD-10-CM

## 2011-08-15 DIAGNOSIS — D509 Iron deficiency anemia, unspecified: Secondary | ICD-10-CM

## 2011-08-15 DIAGNOSIS — F419 Anxiety disorder, unspecified: Secondary | ICD-10-CM

## 2011-08-15 DIAGNOSIS — D500 Iron deficiency anemia secondary to blood loss (chronic): Secondary | ICD-10-CM

## 2011-08-15 DIAGNOSIS — F411 Generalized anxiety disorder: Secondary | ICD-10-CM

## 2011-08-15 LAB — CBC & DIFF AND RETIC
BASO%: 0.2 % (ref 0.0–2.0)
Basophils Absolute: 0 10*3/uL (ref 0.0–0.1)
EOS%: 7.2 % — ABNORMAL HIGH (ref 0.0–7.0)
HGB: 13.8 g/dL (ref 11.6–15.9)
Immature Retic Fract: 5.6 % (ref 1.60–10.00)
MCH: 29.2 pg (ref 25.1–34.0)
MCHC: 34.1 g/dL (ref 31.5–36.0)
RDW: 14.6 % — ABNORMAL HIGH (ref 11.2–14.5)
Retic %: 1.82 % (ref 0.70–2.10)
Retic Ct Abs: 86.09 10*3/uL (ref 33.70–90.70)
lymph#: 2.7 10*3/uL (ref 0.9–3.3)

## 2011-08-15 MED ORDER — SERTRALINE HCL 100 MG PO TABS: 100.0000 mg | ORAL_TABLET | Freq: Every day | ORAL | Status: DC
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
## 2011-10-03 ENCOUNTER — Telehealth: Payer: Self-pay | Admitting: Oncology

## 2011-10-03 NOTE — Telephone Encounter (Signed)
per pof 05/10/2011 called pts home lmovm with appts for jan-july2013.  asked pt to rtn call to confirm appts

## 2011-11-07 ENCOUNTER — Telehealth: Payer: Self-pay | Admitting: *Deleted

## 2011-11-07 NOTE — Telephone Encounter (Signed)
Pt called c/o period since christmas day, she said heavy bleeding since 28 th and still bleeding heavy. She is using a tampon and pad. On Monday she started feeling very tired,nauesa. Pt has Friday off from work if she needs office visit. Please advise

## 2011-11-07 NOTE — Telephone Encounter (Signed)
Please have her schedule office visit For Friday a.m.

## 2011-11-07 NOTE — Telephone Encounter (Signed)
Please see below note

## 2011-11-07 NOTE — Telephone Encounter (Signed)
Pt informed to make appointment.  

## 2011-11-09 ENCOUNTER — Ambulatory Visit (INDEPENDENT_AMBULATORY_CARE_PROVIDER_SITE_OTHER): Payer: 59 | Admitting: Women's Health

## 2011-11-09 ENCOUNTER — Encounter: Payer: Self-pay | Admitting: Women's Health

## 2011-11-09 DIAGNOSIS — N938 Other specified abnormal uterine and vaginal bleeding: Secondary | ICD-10-CM

## 2011-11-09 DIAGNOSIS — N949 Unspecified condition associated with female genital organs and menstrual cycle: Secondary | ICD-10-CM

## 2011-11-09 MED ORDER — MEGESTROL ACETATE 40 MG PO TABS: 40.0000 mg | ORAL_TABLET | Freq: Every day | ORAL | Status: AC

## 2011-11-09 NOTE — Progress Notes (Signed)
Patient ID: Tara Duffy, female   DOB: December 17, 1960, 51 y.o.   MRN: [id number] Presents with a complaint of heavy menstrual bleeding. Cycle started on 12/25, heavy flow with clots using a pad and tampon every 1-2 hours. History of iron deficiency anemia where she sees Dr. Darnelle Catalan, has iron infusions, have scheduled appointment next week. History of BTL and has not been sexually active in years. Prior to this cycle, cycles were every 1-2 months for 4-5 days with menorrhagia. States also having some cramping. Denies fever, urinary symptoms, or discharge.  Exam: External genitalia is within normal limits, speculum exam copious menses type blood, cervix pink healthy without lesion. Bimanual no CMT or adnexal fullness or tenderness.  DUB  Plan: TSH and prolactin. Keep scheduled appointment with Dr.Magrinat next week. Megace 40 mg by mouth twice a day for at least 10 days or until bleeding stops. Instructed to call if bleeding continues greater than one week with Megace. Schedule sonohysterogram in 2 weeks with Dr. Eda Paschal. Endometrial ablation discussed.

## 2011-11-20 ENCOUNTER — Other Ambulatory Visit: Payer: 59 | Admitting: Lab

## 2011-11-20 ENCOUNTER — Telehealth: Payer: Self-pay | Admitting: *Deleted

## 2011-11-20 LAB — CBC & DIFF AND RETIC
BASO%: 0.3 % (ref 0.0–2.0)
LYMPH%: 36.3 % (ref 14.0–49.7)
MCH: 28.8 pg (ref 25.1–34.0)
MCHC: 33.5 g/dL (ref 31.5–36.0)
MCV: 86 fL (ref 79.5–101.0)
MONO%: 4.2 % (ref 0.0–14.0)
Platelets: 253 10*3/uL (ref 145–400)
RBC: 3.99 10*6/uL (ref 3.70–5.45)
Retic %: 2.4 % — ABNORMAL HIGH (ref 0.70–2.10)

## 2011-11-20 NOTE — Telephone Encounter (Signed)
Pt was seen on 11/09/11 for DUB she was giving megace 40 mg  mouth daily. Pt was told to follow up if bleeding continues, pt said she is still bleeding. Please advise

## 2011-11-21 NOTE — Telephone Encounter (Signed)
Telephone call states started bleeding heavy today, took Megace 40 twice today. (had been taking once daily after bleeding decreased)   Will continue Megace until sonohysterogram appointment with Dr. Lily Peer next week.

## 2011-11-21 NOTE — Telephone Encounter (Signed)
Message left on cell phone to call the office in regards to message.

## 2011-11-26 ENCOUNTER — Other Ambulatory Visit: Payer: Self-pay | Admitting: Oncology

## 2011-11-27 ENCOUNTER — Other Ambulatory Visit: Payer: Self-pay | Admitting: Gynecology

## 2011-11-27 ENCOUNTER — Telehealth: Payer: Self-pay | Admitting: *Deleted

## 2011-11-27 DIAGNOSIS — N938 Other specified abnormal uterine and vaginal bleeding: Secondary | ICD-10-CM

## 2011-11-27 NOTE — Telephone Encounter (Signed)
see 11/20/11 telephone encounter) Pt has [facility] scheduled tomorrow and said that she is still having bleeding, not as heavy as before. Pt would like to know if she should reschedule? Please advise  Patient to keep her appointment tomorrow for sonohysterogram as previously scheduled.

## 2011-11-27 NOTE — Telephone Encounter (Signed)
Left message on pt vm to keep appointment.

## 2011-11-27 NOTE — Telephone Encounter (Signed)
(  see 11/20/11 telephone encounter) Pt has [facility] scheduled tomorrow and said that she is still having bleeding, not as heavy as before. Pt would like to know if she should reschedule? Please advise

## 2011-11-28 ENCOUNTER — Ambulatory Visit (INDEPENDENT_AMBULATORY_CARE_PROVIDER_SITE_OTHER): Payer: 59

## 2011-11-28 ENCOUNTER — Ambulatory Visit (INDEPENDENT_AMBULATORY_CARE_PROVIDER_SITE_OTHER): Payer: 59 | Admitting: Gynecology

## 2011-11-28 ENCOUNTER — Encounter: Payer: Self-pay | Admitting: Gynecology

## 2011-11-28 ENCOUNTER — Telehealth: Payer: Self-pay

## 2011-11-28 ENCOUNTER — Other Ambulatory Visit: Payer: 59

## 2011-11-28 ENCOUNTER — Ambulatory Visit: Payer: 59 | Admitting: Gynecology

## 2011-11-28 DIAGNOSIS — D251 Intramural leiomyoma of uterus: Secondary | ICD-10-CM

## 2011-11-28 DIAGNOSIS — N938 Other specified abnormal uterine and vaginal bleeding: Secondary | ICD-10-CM

## 2011-11-28 DIAGNOSIS — N949 Unspecified condition associated with female genital organs and menstrual cycle: Secondary | ICD-10-CM

## 2011-11-28 DIAGNOSIS — D259 Leiomyoma of uterus, unspecified: Secondary | ICD-10-CM

## 2011-11-28 DIAGNOSIS — N840 Polyp of corpus uteri: Secondary | ICD-10-CM

## 2011-11-28 DIAGNOSIS — D649 Anemia, unspecified: Secondary | ICD-10-CM

## 2011-11-28 DIAGNOSIS — N852 Hypertrophy of uterus: Secondary | ICD-10-CM

## 2011-11-28 DIAGNOSIS — N920 Excessive and frequent menstruation with regular cycle: Secondary | ICD-10-CM

## 2011-11-28 NOTE — Progress Notes (Signed)
Tara Duffy is an 51 y.o. female. Who had presented today to the office for preoperative history and physical as a result of findings during sonohysterogram today. Patient with history of dysfunction uterine bleeding and iron deficiency anemia. Patient has been followed by the hematologist oncologist (Dr. Darnelle Catalan) who has given her iron infusions. Her last hemoglobin on January 22 was 11.5. Today's sonohysterogram demonstrated the following:  Uterus measured 10.7 x 8.5 x 6.3 cm endometrial stripe 14.1 mm. Uterus is anteverted. 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 Both right and left ovaries were normal.  Pertinent Gynecological History: Menses: Patient had been bleeding since December 25, heavy flow with passage of large clots and changing tampons every 1-2 hours. Also complaining of cramping as well. Bleeding: As above Contraception: tubal ligation DES exposure: denies Blood transfusions: none Sexually transmitted diseases: no past history Previous GYN Procedures: 3 C-sections and one tubal ligation.  Last mammogram: normal Date: 2012 Last pap: normal Date: 2012 OB History: G 5, P 5 (set of twins)   Menstrual History: Menarche age: 65 Patient's last menstrual period was 10/23/2011.    Past Medical History  Diagnosis Date  . Anemia     HISTORY OF  . Gallbladder disease     "GALL BLADDER DYSFUNCTION"  . Depression     Past Surgical History  Procedure Date  . Cesarean section '83,'95,'97    WITH BTL  . Pylonidal cyst 1981  . Knee arthroscopy 04/27/2011    left   . Tubal ligation 1997    Family History  Problem Relation Age of Onset  . Stroke Mother   . Hypertension Father   . Cancer Father     PROSTATE  . Diabetes Brother     Social History:  reports that she has never smoked. She has never used  smokeless tobacco. She reports that she drinks alcohol. She reports that she does not use illicit drugs.  Allergies: No Known Allergies   (Not in a hospital admission)  @ROS @  Last menstrual period 10/23/2011.  Physical Exam:  HEENT:unremarkable Neck:Supple, midline, no thyroid megaly, no carotid bruits Lungs:  Clear to auscultation no rhonchi's or wheezes Heart:Regular rate and rhythm, no murmurs or gallops Breast Exam: Done at time of annual exam had been reported to be normal Abdomen: Soft nontender no rebound or guarding Pelvic:BUS within normal limits Vagina: Brownish discharge noted Cervix: Brownish discharge with no cervical lesions noted Uterus: Anteverted upper limits of normal Adnexa: No palpable ligament so masses or tenderness Extremities: No cords, no edema Rectal: Not done  No results found for this or any previous visit (from the past 24 hour(s)).  No results found.  Assessment/Plan: 51 year old patient with iron deficiency anemia attributed to her menorrhagia. Sonohysterogram demonstrating evidence of a large endometrial polyp. Patient had been placed on Megace 40 mg twice a day to stop her bleeding. She will continue on this regimen as well as iron supplementation  daily. We discussed about scheduling a resectoscopic polypectomy and simultaneously doing an endometrial ablation. Literature information was provided. The risks benefits and pros and cons of the operation were discussed to include the following: Perforation and trauma requiring emergency laparotomy and emergency hysterectomy. In the event of hemorrhage she would be at risk for blood transfusion with potential risk of hepatitis AIDS and anaphylactic reaction. She'll receive intravenous antibiotics for prophylaxis as  well as PAS stockings for DVT prophylaxis. All these issues were discussed with the patient all questions rancher will follow accordingly. Of note today at the time of sonohysterogram and  endometrial biopsy was done result pending at time of this dictation.  Anyi Fels H 11/28/2011, 11:02 AM

## 2011-11-28 NOTE — Patient Instructions (Signed)
Tara Duffy, we'll be calling you later in the week to schedule her surgery. Since we get the pathology report from today's biopsy will inform you.

## 2011-11-28 NOTE — Telephone Encounter (Signed)
I contacted patient to schedule surgery.  She needed to work it around her work schedule and we scheduled her for Thursday, Feb 14 at 1pm.  I contacted the [place] Rep and he will be there too.  Dr. Glenetta Hew indicated he would not need to see patient again.  Pt informed she will hear from [facility] to scheduled her hospital preop and give her instructions.

## 2011-11-29 ENCOUNTER — Other Ambulatory Visit: Payer: Self-pay | Admitting: *Deleted

## 2011-11-29 ENCOUNTER — Telehealth: Payer: Self-pay | Admitting: *Deleted

## 2011-11-29 NOTE — Telephone Encounter (Signed)
patient confirmed over the phone the new date and time 12-06-2011 and 12-14-2011 for her injections

## 2011-12-03 ENCOUNTER — Encounter (HOSPITAL_COMMUNITY): Payer: Self-pay | Admitting: Pharmacist

## 2011-12-06 ENCOUNTER — Ambulatory Visit: Payer: 59

## 2011-12-07 ENCOUNTER — Other Ambulatory Visit: Payer: Self-pay | Admitting: Oncology

## 2011-12-07 DIAGNOSIS — N938 Other specified abnormal uterine and vaginal bleeding: Secondary | ICD-10-CM

## 2011-12-12 ENCOUNTER — Telehealth: Payer: Self-pay

## 2011-12-12 NOTE — Telephone Encounter (Signed)
Patient called back and left me a message and said she wants to leave everything scheduled as is for tomorrow.  She will talk with Dr Glenetta Hew later regarding ur incontinence with exercise.

## 2011-12-12 NOTE — Telephone Encounter (Signed)
Per patient request I left detailed message with the info below in her voice mail.  I asked her to followup with me and let me know what she would like to do.

## 2011-12-12 NOTE — Telephone Encounter (Signed)
Patient called today 24 hours prior to her scheduled surgery wondering if we could "tac her bladder" for stress urinary incontinence. Review my notes indicated she never brought this up and she would need a full urodynamic evaluation to determine the actual cause of her stress urine or incontinence. I would recommend we proceed with the above-mentioned procedure and then several weeks later after postop visit we can proceed with a full urodynamic evaluation to determine etiology and plan a course of management.  Unless, the patient was to cancel her surgery and we can schedule the urodynamic evaluation next couple weeks?

## 2011-12-12 NOTE — Telephone Encounter (Signed)
Patient called to see if when she has her Resectoscopic Polypectomy/Endo Ablation at [facility] tomorrow if Dr. Glenetta Hew could "tack up her bladder at the same time while he is in there".    She c/o some leaking urine with exercise.  Please advise what to recommend for patient. Should she schedule longer post op visit so she can talk with you about addressing this issue?  Urodynamics?  Please advise.

## 2011-12-13 ENCOUNTER — Encounter (HOSPITAL_COMMUNITY)
Admission: RE | Disposition: A | Discharge status: Home / Self Care | Payer: Self-pay | Source: Ambulatory Visit | Attending: Gynecology

## 2011-12-13 ENCOUNTER — Encounter (HOSPITAL_COMMUNITY): Payer: Self-pay | Admitting: Anesthesiology

## 2011-12-13 ENCOUNTER — Ambulatory Visit (HOSPITAL_COMMUNITY)
Admission: RE | Admit: 2011-12-13 | Discharge: 2011-12-13 | Disposition: A | Discharge status: Home / Self Care | Payer: 59 | Source: Ambulatory Visit | Attending: Gynecology | Admitting: Gynecology

## 2011-12-13 ENCOUNTER — Ambulatory Visit (HOSPITAL_COMMUNITY): Payer: 59 | Admitting: Anesthesiology

## 2011-12-13 ENCOUNTER — Encounter (HOSPITAL_COMMUNITY): Payer: Self-pay | Admitting: *Deleted

## 2011-12-13 DIAGNOSIS — N938 Other specified abnormal uterine and vaginal bleeding: Secondary | ICD-10-CM

## 2011-12-13 DIAGNOSIS — N949 Unspecified condition associated with female genital organs and menstrual cycle: Secondary | ICD-10-CM

## 2011-12-13 DIAGNOSIS — N840 Polyp of corpus uteri: Secondary | ICD-10-CM | POA: Insufficient documentation

## 2011-12-13 LAB — URINALYSIS, ROUTINE W REFLEX MICROSCOPIC
Nitrite: NEGATIVE
Specific Gravity, Urine: >1.03 — ABNORMAL HIGH (ref 1.005–1.030)
Urobilinogen, UA: 0.2 mg/dL (ref 0.0–1.0)
pH: 6 (ref 5.0–8.0)

## 2011-12-13 LAB — URINE MICROSCOPIC-ADD ON

## 2011-12-13 LAB — CBC
HCT: 35.1 % — ABNORMAL LOW (ref 36.0–46.0)
Platelets: 266 10*3/uL (ref 150–400)
RBC: 4.1 MIL/uL (ref 3.87–5.11)
RDW: 13.2 % (ref 11.5–15.5)
WBC: 5.9 10*3/uL (ref 4.0–10.5)

## 2011-12-13 LAB — PREGNANCY, URINE: Preg Test, Ur: NEGATIVE

## 2011-12-13 SURGERY — DILATATION & CURETTAGE/HYSTEROSCOPY WITH RESECTOCOPE
Anesthesia: General | Site: Uterus | Wound class: Clean Contaminated

## 2011-12-13 MED ORDER — OXYCODONE-ACETAMINOPHEN 5-500 MG PO CAPS: 1.0000 | ORAL_CAPSULE | ORAL | Status: AC | PRN

## 2011-12-13 MED ORDER — FENTANYL CITRATE 0.05 MG/ML IJ SOLN
25.0000 ug | INTRAMUSCULAR | Status: DC | PRN
  Administered 2011-12-13 (×2): 50 ug via INTRAVENOUS

## 2011-12-13 MED ORDER — KETOROLAC TROMETHAMINE 30 MG/ML IJ SOLN: 15.0000 mg | Freq: Once | INTRAMUSCULAR | Status: DC | PRN

## 2011-12-13 MED ORDER — GLYCOPYRROLATE 0.2 MG/ML IJ SOLN
INTRAMUSCULAR | Status: DC | PRN
  Administered 2011-12-13: 0.2 mg via INTRAVENOUS

## 2011-12-13 MED ORDER — MIDAZOLAM HCL 5 MG/5ML IJ SOLN
INTRAMUSCULAR | Status: DC | PRN
  Administered 2011-12-13: 2 mg via INTRAVENOUS

## 2011-12-13 MED ORDER — FENTANYL CITRATE 0.05 MG/ML IJ SOLN
INTRAMUSCULAR | Status: AC
  Filled 2011-12-13: qty 2

## 2011-12-13 MED ORDER — LIDOCAINE HCL (CARDIAC) 20 MG/ML IV SOLN
INTRAVENOUS | Status: AC
  Filled 2011-12-13: qty 5

## 2011-12-13 MED ORDER — GLYCINE 1.5 % IR SOLN
Status: DC | PRN
  Administered 2011-12-13: 9000 mL

## 2011-12-13 MED ORDER — CEFAZOLIN SODIUM 1-5 GM-% IV SOLN
INTRAVENOUS | Status: AC
  Filled 2011-12-13: qty 50

## 2011-12-13 MED ORDER — DEXAMETHASONE SODIUM PHOSPHATE 10 MG/ML IJ SOLN
INTRAMUSCULAR | Status: AC
  Filled 2011-12-13: qty 1

## 2011-12-13 MED ORDER — ONDANSETRON HCL 4 MG/2ML IJ SOLN
INTRAMUSCULAR | Status: AC
  Filled 2011-12-13: qty 2

## 2011-12-13 MED ORDER — DEXAMETHASONE SODIUM PHOSPHATE 10 MG/ML IJ SOLN
INTRAMUSCULAR | Status: DC | PRN
  Administered 2011-12-13: 10 mg via INTRAVENOUS

## 2011-12-13 MED ORDER — CEFAZOLIN SODIUM 1-5 GM-% IV SOLN
INTRAVENOUS | Status: DC | PRN
  Administered 2011-12-13: 1 g via INTRAVENOUS

## 2011-12-13 MED ORDER — LACTATED RINGERS IV SOLN
INTRAVENOUS | Status: DC
  Administered 2011-12-13: 12:00:00 via INTRAVENOUS

## 2011-12-13 MED ORDER — PROMETHAZINE HCL 25 MG/ML IJ SOLN: 6.2500 mg | INTRAMUSCULAR | Status: DC | PRN

## 2011-12-13 MED ORDER — ONDANSETRON HCL 4 MG/2ML IJ SOLN
INTRAMUSCULAR | Status: DC | PRN
  Administered 2011-12-13: 4 mg via INTRAVENOUS

## 2011-12-13 MED ORDER — MEPERIDINE HCL 25 MG/ML IJ SOLN: 6.2500 mg | INTRAMUSCULAR | Status: DC | PRN

## 2011-12-13 MED ORDER — SODIUM CHLORIDE 0.9 % IR SOLN
Status: DC | PRN
  Administered 2011-12-13: 6000 mL

## 2011-12-13 MED ORDER — PROPOFOL 10 MG/ML IV EMUL
INTRAVENOUS | Status: AC
  Filled 2011-12-13: qty 20

## 2011-12-13 MED ORDER — SILVER NITRATE-POT NITRATE 75-25 % EX MISC
CUTANEOUS | Status: AC
  Filled 2011-12-13: qty 1

## 2011-12-13 MED ORDER — GLYCOPYRROLATE 0.2 MG/ML IJ SOLN
INTRAMUSCULAR | Status: AC
  Filled 2011-12-13: qty 1

## 2011-12-13 MED ORDER — MIDAZOLAM HCL 2 MG/2ML IJ SOLN
INTRAMUSCULAR | Status: AC
  Filled 2011-12-13: qty 2

## 2011-12-13 MED ORDER — PROPOFOL 10 MG/ML IV EMUL
INTRAVENOUS | Status: DC | PRN
  Administered 2011-12-13: 200 mg via INTRAVENOUS

## 2011-12-13 MED ORDER — LIDOCAINE HCL (CARDIAC) 20 MG/ML IV SOLN
INTRAVENOUS | Status: DC | PRN
  Administered 2011-12-13: 40 mg via INTRAVENOUS

## 2011-12-13 MED ORDER — FENTANYL CITRATE 0.05 MG/ML IJ SOLN
INTRAMUSCULAR | Status: AC
  Filled 2011-12-13: qty 5

## 2011-12-13 MED ORDER — FENTANYL CITRATE 0.05 MG/ML IJ SOLN
INTRAMUSCULAR | Status: DC | PRN
  Administered 2011-12-13: 50 ug via INTRAVENOUS
  Administered 2011-12-13: 100 ug via INTRAVENOUS
  Administered 2011-12-13 (×2): 50 ug via INTRAVENOUS

## 2011-12-13 MED ORDER — METOCLOPRAMIDE HCL 10 MG PO TABS: 10.0000 mg | ORAL_TABLET | Freq: Three times a day (TID) | ORAL | Status: AC

## 2011-12-13 MED ORDER — SILVER NITRATE-POT NITRATE 75-25 % EX MISC
CUTANEOUS | Status: DC | PRN
  Administered 2011-12-13: 3

## 2011-12-13 SURGICAL SUPPLY — 22 items
CANISTER SUCTION 2500CC (MISCELLANEOUS) ×1 IMPLANT
CATH ROBINSON RED A/P 16FR (CATHETERS) ×2 IMPLANT
CLOTH BEACON ORANGE TIMEOUT ST (SAFETY) ×2 IMPLANT
CONTAINER PREFILL 10% NBF 60ML (FORM) ×2 IMPLANT
CORD ACTIVE DISPOSABLE (ELECTRODE) ×1
CORD ELECTRO ACTIVE DISP (ELECTRODE) ×1 IMPLANT
ELECT LOOP GYNE PRO 24FR (CUTTING LOOP)
ELECT REM PT RETURN 9FT ADLT (ELECTROSURGICAL) ×2
ELECT VAPORTRODE GRVD BAR (ELECTRODE) IMPLANT
ELECTRODE LOOP GYNE PRO 24FR (CUTTING LOOP) IMPLANT
ELECTRODE REM PT RTRN 9FT ADLT (ELECTROSURGICAL) ×1 IMPLANT
ELECTRODE ROLLER BARREL 22FR (ELECTROSURGICAL) ×1 IMPLANT
GLOVE BIOGEL PI IND STRL 8 (GLOVE) ×1 IMPLANT
GLOVE BIOGEL PI INDICATOR 8 (GLOVE) ×1
GLOVE ECLIPSE 7.5 STRL STRAW (GLOVE) ×4 IMPLANT
GOWN PREVENTION PLUS LG XLONG (DISPOSABLE) ×4 IMPLANT
GOWN STRL REIN XL XLG (GOWN DISPOSABLE) ×2 IMPLANT
PACK HYSTEROSCOPY LF (CUSTOM PROCEDURE TRAY) ×2 IMPLANT
PAD PREP 24X48 CUFFED NSTRL (MISCELLANEOUS) ×2 IMPLANT
ROLLER BAR ANGLED (ELECTRODE) IMPLANT
TOWEL OR 17X24 6PK STRL BLUE (TOWEL DISPOSABLE) ×4 IMPLANT
WATER STERILE IRR 1000ML POUR (IV SOLUTION) ×2 IMPLANT

## 2011-12-13 NOTE — Interval H&P Note (Signed)
History and Physical Interval Note:  12/13/2011 12:17 PM  Tara Duffy  has presented today for surgery, with the diagnosis of dysfunctional uterine bleeding, endometrial polyp  The various methods of treatment have been discussed with the patient and family. After consideration of risks, benefits and other options for treatment, the patient has consented to  Procedure(s) (LRB): DILATATION & CURETTAGE/HYSTEROSCOPY WITH RESECTOCOPE (N/A) as a surgical intervention .  The patients' history has been reviewed, patient examined, no change in status, stable for surgery.  I have reviewed the patients' chart and labs.  Questions were answered to the patient's satisfaction.     Ok Edwards

## 2011-12-13 NOTE — Anesthesia Postprocedure Evaluation (Signed)
Anesthesia Post Note  Patient: Tara Duffy  Procedure(s) Performed: Procedure(s) (LRB): DILATATION & CURETTAGE/HYSTEROSCOPY WITH RESECTOCOPE (N/A)  Anesthesia type: General  Patient location: PACU  Post pain: Pain level controlled  Post assessment: Post-op Vital signs reviewed  Last Vitals:  Filed Vitals:   12/13/11 1145  BP: 115/77  Pulse: 89  Temp: 36.9 C  Resp: 16    Post vital signs: Reviewed  Level of consciousness: sedated  Complications: No apparent anesthesia complicationsfj

## 2011-12-13 NOTE — Op Note (Signed)
12/13/2011  2:25 PM  PATIENT:  Tara Duffy  51 y.o. female  PRE-OPERATIVE DIAGNOSIS:  dysfunctional uterine bleeding, endometrial polyp  POST-OPERATIVE DIAGNOSIS:  dysfunctional uterine bleeding, endometrial polyp  PROCEDURE:  Procedure(s): Diagnostic hysteroscopy with endometrial ablation via roller-bar technique  SURGEON:  Surgeon(s): Ok Edwards, MD  ANESTHESIA:   general  FINDINGS: Normal intrauterine cavity both tubal ostia identified along with smooth endocervical canal  DESCRIPTION OF OPERATION: The patient was taken to the operating room where she underwent a general endotracheal anesthesia. She had PAS stockings for DVT prophylaxis and received a gram of Cefotan for prophylaxis as well. She was placed in the high lithotomy position. A red rubber Roxan Hockey was inserted to evacuate the bladder of its content for a total 20 cc. Bimanual examination demonstrated an anteverted uterus approximately 8 weeks size with no palpable adnexal masses. A weighted speculum was placed in the posterior vaginal vault. A Sims retractor was placed on the anterior vaginal vault. A single-tooth tenaculum was placed on the anterior cervical lip. The uterus sounded to 8 cm. The endocervical canal was dilated with a Pratt dilator to 9 mm. The clear view operative resectoscope (8.5 mm) was introduced into the intrauterine cavity. 0.9% normal saline was the distending media. A systematic inspection of the intrauterine cavity did not demonstrate any endometrial polyp as has previously been seen as an outpatient during sonohysterogram. Patient had been placed on Prometrium 200 mg daily for 2 weeks prior to the procedure. Patient with prior benign endometrial biopsy. With patient bleeding she could have passed the polyp because there was no evidence seen on inspection today. Both tubal ostia were identified and she had a smooth endocervical canal. The working element was then changed to an operative  resectoscope with a roller-bar attachment for the endometrial ablation. Of note a wash out from the normal saline solution was undertaken to replace the distending media with 1.5% glycine. There was no fluid deficit with the diagnostic portion of the procedure. The endometrial cavity was ablated in a circumferential fashion to the level of the internal cervical os. Caution was taken when ablating near the tubal ostia. Pre-and post ablation pictures were obtained to share with the patient postoperatively. Fluid deficit from the ablation portion of the procedure was 90 cc. The single-tooth tenaculum was removed patient was extubated transferred to recovery stable vital signs. She received 30 mg of Toradol .  ESTIMATED BLOOD LOSS: * No blood loss amount entered *   Intake/Output Summary (Last 24 hours) at 12/13/11 1425 Last data filed at 12/13/11 1400  Gross per 24 hour  Intake   1800 ml  Output     20 ml  Net   1780 ml     BLOOD ADMINISTERED:none   LOCAL MEDICATIONS USED:  NONE  SPECIMEN:  Source of Specimen:  None  DISPOSITION OF SPECIMEN:  N/A  COUNTS:  YES  PLAN OF CARE: Transfer to PACU  [facility] HMD2:25 PMTD@

## 2011-12-13 NOTE — Discharge Instructions (Signed)
Hysteroscopy Hysteroscopy is a procedure used for looking inside the womb (uterus). It may be done for many different reasons, including:  To evaluate abnormal bleeding, fibroid (benign, noncancerous) tumors, polyps, scar tissue (adhesions), and possibly cancer of the uterus.   To look for lumps (tumors) and other uterine growths.   To look for causes of why a woman cannot get pregnant (infertility), causes of recurrent loss of pregnancy (miscarriages), or a lost intrauterine device (IUD).   To perform a sterilization by blocking the fallopian tubes from inside the uterus.  A hysteroscopy should be done right after a menstrual period to be sure you are not pregnant. LET YOUR CAREGIVER KNOW ABOUT:   Allergies.   Medicines taken, including herbs, eyedrops, over-the-counter medicines, and creams.   Use of steroids (by mouth or creams).   Previous problems with anesthetics or numbing medicines.   History of bleeding or blood problems.   History of blood clots.   Possibility of pregnancy, if this applies.   Previous surgery.   Other health problems.  RISKS AND COMPLICATIONS   Putting a hole in the uterus.   Excessive bleeding.   Infection.   Damage to the cervix.   Injury to other organs.   Allergic reaction to medicines.   Too much fluid used in the uterus for the procedure.  BEFORE THE PROCEDURE   Do not take aspirin or blood thinners for a week before the procedure, or as directed. It can cause bleeding.   Arrive at least 60 minutes before the procedure or as directed to read and sign the necessary forms.   Arrange for someone to take you home after the procedure.   If you smoke, do not smoke for 2 weeks before the procedure.  PROCEDURE   Your caregiver may give you medicine to relax you. He or she may also give you a medicine that numbs the area around the cervix (local anesthetic) or a medicine that makes you sleep (general anesthesia).   Sometimes, a  medicine is placed in the cervix the day before the procedure. This medicine makes the cervix have a larger opening (dilate). This makes it easier for the instrument to be inserted into the uterus.   A small instrument (hysteroscope) is inserted through the vagina into the uterus. This instrument is similar to a pencil-sized telescope with a light.   During the procedure, air or a liquid is put into the uterus, which allows the surgeon to see better.   Sometimes, tissue is gently scraped from inside the uterus. These tissue samples are sent to a specialist who looks at tissue samples (pathologist). The pathologist will give a report to your caregiver. This will help your caregiver decide if further treatment is necessary. The report will also help your caregiver decide on the best treatment if the test comes back abnormal.  AFTER THE PROCEDURE   If you had a general anesthetic, you may be groggy for a couple hours after the procedure.   If you had a local anesthetic, you will be advised to rest at the surgical center or caregiver's office until you are stable and feel ready to go home.   You may have some cramping for a couple days.   You may have bleeding, which varies from light spotting for a few days to menstrual-like bleeding for up to 3 to 7 days. This is normal.   Have someone take you home.  FINDING OUT THE RESULTS OF YOUR TEST Not all test results   are available during your visit. If your test results are not back during the visit, make an appointment with your caregiver to find out the results. Do not assume everything is normal if you have not heard from your caregiver or the medical facility. It is important for you to follow up on all of your test results. HOME CARE INSTRUCTIONS   Do not drive for 24 hours or as instructed.   Only take over-the-counter or prescription medicines for pain, discomfort, or fever as directed by your caregiver.   Do not take aspirin. It can cause or  aggravate bleeding.   Do not drive or drink alcohol while taking pain medicine.   You may resume your usual diet.   Do not use tampons, douche, or have sexual intercourse for 2 weeks, or as advised by your caregiver.   Rest and sleep for the first 24 to 48 hours.   Take your temperature twice a day for 4 to 5 days. Write it down. Give these temperatures to your caregiver if they are abnormal (above 98.6 F or 37.0 C).   Take medicines your caregiver has ordered as directed.   Follow your caregiver's advice regarding diet, exercise, lifting, driving, and general activities.   Take showers instead of baths for 2 weeks, or as recommended by your caregiver.   If you develop constipation:   Take a mild laxative with the advice of your caregiver.   Eat bran foods.   Drink enough water and fluids to keep your urine clear or pale yellow.   Try to have someone with you or available to you for the first 24 to 48 hours, especially if you had a general anesthetic.   Make sure you and your family understand everything about your operation and recovery.   Follow your caregiver's advice regarding follow-up appointments and Pap smears.  SEEK MEDICAL CARE IF:   You feel dizzy or lightheaded.   You feel sick to your stomach (nauseous).   You develop abnormal vaginal discharge.   You develop a rash.   You have an abnormal reaction or allergy to your medicine.   You need stronger pain medicine.  SEEK IMMEDIATE MEDICAL CARE IF:   Bleeding is heavier than a normal menstrual period or you have blood clots.   You have an oral temperature above 102 F (38.9 C), not controlled by medicine.   You have increasing cramps or pains not relieved with medicine.   You develop belly (abdominal) pain that does not seem to be related to the same area of earlier cramping and pain.   You pass out.   You develop pain in the tops of your shoulders (shoulder strap areas).   You develop shortness of  breath.  MAKE SURE YOU:   Understand these instructions.   Will watch your condition.   Will get help right away if you are not doing well or get worse.  Document Released: 01/21/2001 Document Revised: 06/27/2011 Document Reviewed: 05/16/2009 [employer] Patient Information 2012 [employer]. 

## 2011-12-13 NOTE — Transfer of Care (Signed)
Immediate Anesthesia Transfer of Care Note  Patient: Tara Duffy  Procedure(s) Performed: Procedure(s) (LRB): DILATATION & CURETTAGE/HYSTEROSCOPY WITH RESECTOCOPE (N/A)  Patient Location: PACU  Anesthesia Type: General  Level of Consciousness: awake, alert  and oriented  Airway & Oxygen Therapy: Patient Spontanous Breathing and Patient connected to nasal cannula oxygen  Post-op Assessment: Report given to PACU RN and Post -op Vital signs reviewed and stable  Post vital signs: Reviewed and stable  Complications: No apparent anesthesia complications

## 2011-12-13 NOTE — Anesthesia Preprocedure Evaluation (Signed)
Anesthesia Evaluation  Patient identified by MRN, date of birth, ID band Patient awake    Reviewed: Allergy & Precautions, H&P , NPO status , Patient's Chart, lab work & pertinent test results  Airway Mallampati: II TM Distance: >3 FB Neck ROM: full    Dental No notable dental hx. (+) Teeth Intact   Pulmonary neg pulmonary ROS,    Pulmonary exam normal       Cardiovascular neg cardio ROS     Neuro/Psych PSYCHIATRIC DISORDERS Depression Negative Neurological ROS     GI/Hepatic negative GI ROS, Neg liver ROS,   Endo/Other  Morbid obesity  Renal/GU negative Renal ROS  Genitourinary negative   Musculoskeletal negative musculoskeletal ROS (+)   Abdominal (+) obese,   Peds negative pediatric ROS (+)  Hematology negative hematology ROS (+)   Anesthesia Other Findings   Reproductive/Obstetrics negative OB ROS                           Anesthesia Physical Anesthesia Plan  ASA: III  Anesthesia Plan: General   Post-op Pain Management:    Induction: Intravenous  Airway Management Planned: LMA  Additional Equipment:   Intra-op Plan:   Post-operative Plan:   Informed Consent: I have reviewed the patients History and Physical, chart, labs and discussed the procedure including the risks, benefits and alternatives for the proposed anesthesia with the patient or authorized representative who has indicated his/her understanding and acceptance.     Plan Discussed with: CRNA  Anesthesia Plan Comments:         Anesthesia Quick Evaluation

## 2011-12-13 NOTE — H&P (View-Only) (Signed)
Tara Duffy is an 50 y.o. female. Who had presented today to the office for preoperative history and physical as a result of findings during sonohysterogram today. Patient with history of dysfunction uterine bleeding and iron deficiency anemia. Patient has been followed by the hematologist oncologist (Dr. Magrinat) who has given her iron infusions. Her last hemoglobin on January 22 was 11.5. Today's sonohysterogram demonstrated the following:  Uterus measured 10.7 x 8.5 x 6.3 cm endometrial stripe 14.1 mm. Uterus is anteverted. 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 Both right and left ovaries were normal.  Pertinent Gynecological History: Menses: Patient had been bleeding since December 25, heavy flow with passage of large clots and changing tampons every 1-2 hours. Also complaining of cramping as well. Bleeding: As above Contraception: tubal ligation DES exposure: denies Blood transfusions: none Sexually transmitted diseases: no past history Previous GYN Procedures: 3 C-sections and one tubal ligation.  Last mammogram: normal Date: 2012 Last pap: normal Date: 2012 OB History: G 5, P 5 (set of twins)   Menstrual History: Menarche age: 13 Patient's last menstrual period was 10/23/2011.    Past Medical History  Diagnosis Date  . Anemia     HISTORY OF  . Gallbladder disease     "GALL BLADDER DYSFUNCTION"  . Depression     Past Surgical History  Procedure Date  . Cesarean section '83,'95,'97    WITH BTL  . Pylonidal cyst 1981  . Knee arthroscopy 04/27/2011    left   . Tubal ligation 1997    Family History  Problem Relation Age of Onset  . Stroke Mother   . Hypertension Father   . Cancer Father     PROSTATE  . Diabetes Brother     Social History:  reports that she has never smoked. She has never used  smokeless tobacco. She reports that she drinks alcohol. She reports that she does not use illicit drugs.  Allergies: No Known Allergies   (Not in a hospital admission)  @ROS@  Last menstrual period 10/23/2011.  Physical Exam:  HEENT:unremarkable Neck:Supple, midline, no thyroid megaly, no carotid bruits Lungs:  Clear to auscultation no rhonchi's or wheezes Heart:Regular rate and rhythm, no murmurs or gallops Breast Exam: Done at time of annual exam had been reported to be normal Abdomen: Soft nontender no rebound or guarding Pelvic:BUS within normal limits Vagina: Brownish discharge noted Cervix: Brownish discharge with no cervical lesions noted Uterus: Anteverted upper limits of normal Adnexa: No palpable ligament so masses or tenderness Extremities: No cords, no edema Rectal: Not done  No results found for this or any previous visit (from the past 24 hour(s)).  No results found.  Assessment/Plan: 50-year-old patient with iron deficiency anemia attributed to her menorrhagia. Sonohysterogram demonstrating evidence of a large endometrial polyp. Patient had been placed on Megace 40 mg twice a day to stop her bleeding. She will continue on this regimen as well as iron supplementation  daily. We discussed about scheduling a resectoscopic polypectomy and simultaneously doing an endometrial ablation. Literature information was provided. The risks benefits and pros and cons of the operation were discussed to include the following: Perforation and trauma requiring emergency laparotomy and emergency hysterectomy. In the event of hemorrhage she would be at risk for blood transfusion with potential risk of hepatitis AIDS and anaphylactic reaction. She'll receive intravenous antibiotics for prophylaxis as   well as PAS stockings for DVT prophylaxis. All these issues were discussed with the patient all questions rancher will follow accordingly. Of note today at the time of sonohysterogram and  endometrial biopsy was done result pending at time of this dictation.  Tara Duffy 11/28/2011, 11:02 AM   

## 2011-12-14 ENCOUNTER — Other Ambulatory Visit: Payer: 59 | Admitting: Lab

## 2011-12-14 ENCOUNTER — Ambulatory Visit (HOSPITAL_BASED_OUTPATIENT_CLINIC_OR_DEPARTMENT_OTHER): Payer: 59

## 2011-12-14 ENCOUNTER — Telehealth: Payer: Self-pay

## 2011-12-14 VITALS — BP 104/72 | HR 71 | Temp 98.3°F

## 2011-12-14 DIAGNOSIS — D649 Anemia, unspecified: Secondary | ICD-10-CM

## 2011-12-14 DIAGNOSIS — N926 Irregular menstruation, unspecified: Secondary | ICD-10-CM

## 2011-12-14 DIAGNOSIS — N939 Abnormal uterine and vaginal bleeding, unspecified: Secondary | ICD-10-CM

## 2011-12-14 MED ORDER — SODIUM CHLORIDE 0.9 % IV SOLN
1020.0000 mg | Freq: Once | INTRAVENOUS | Status: AC
  Administered 2011-12-14: 1020 mg via INTRAVENOUS
  Filled 2011-12-14: qty 34

## 2011-12-14 MED ORDER — SODIUM CHLORIDE 0.9 % IV SOLN
Freq: Once | INTRAVENOUS | Status: AC
  Administered 2011-12-14: 14:00:00 via INTRAVENOUS

## 2011-12-14 NOTE — Telephone Encounter (Signed)
Patient had surgery yesterday. Asked if she should continue taking the Megestrol she was taking prior to surgery?

## 2011-12-14 NOTE — Telephone Encounter (Signed)
Pt informed

## 2011-12-14 NOTE — Telephone Encounter (Signed)
Patient should finish off the Megace that was previously prescribed with no refills.

## 2011-12-17 ENCOUNTER — Telehealth: Payer: Self-pay | Admitting: Oncology

## 2011-12-17 NOTE — Telephone Encounter (Signed)
Pt called to r/s her lab and md appt and tx from 02/07/2012 to 02/06/2012

## 2011-12-17 NOTE — Telephone Encounter (Signed)
lmonvm advising the pt of her f/u appt in April 2013

## 2011-12-25 ENCOUNTER — Other Ambulatory Visit: Payer: Self-pay | Admitting: Gynecology

## 2011-12-25 ENCOUNTER — Telehealth: Payer: Self-pay | Admitting: *Deleted

## 2011-12-25 ENCOUNTER — Encounter: Payer: Self-pay | Admitting: Gynecology

## 2011-12-25 ENCOUNTER — Ambulatory Visit (INDEPENDENT_AMBULATORY_CARE_PROVIDER_SITE_OTHER): Payer: 59 | Admitting: Gynecology

## 2011-12-25 VITALS — BP 124/78

## 2011-12-25 DIAGNOSIS — Z9889 Other specified postprocedural states: Secondary | ICD-10-CM

## 2011-12-25 MED ORDER — CLINDAMYCIN PHOSPHATE 2 % VA CREA: 1.0000 | TOPICAL_CREAM | Freq: Every day | VAGINAL | Status: AC

## 2011-12-25 NOTE — Telephone Encounter (Signed)
Pt informed with the below note, pt will pick up cream as directed.

## 2011-12-25 NOTE — Telephone Encounter (Signed)
Message copied by Aura Camps on Tue Dec 25, 2011  3:52 PM ------      Message from: Ok Edwards      Created: Tue Dec 25, 2011  3:28 PM       Victorino Dike, please inform patient that her endometrial biopsy which had been done before her surgery was benign. Tell her to go by the pharmacy to pick up the Cleocin vaginal cream which I have prescribed for her.

## 2011-12-25 NOTE — Progress Notes (Signed)
Patient presented to the office today for her postop visit. Patient status post diagnostic hysteroscopy with endometrial ablation via roller-bar technique. Preoperatively the her sonohysterogram had demonstrated what appeared to be a large endometrial polyp which was not seen at time of hysteroscopic evaluation in the operating room. Since she was bleeding she may have passed along with clots. She underwent a successful roller-bar endometrial ablation on 12/13/2011. Preoperatively her endometrial biopsy demonstrated the following path report:  Endometrium, biopsy DEGENERATING SECRETORY-TYPE ENDOMETRIUM WITH STROMAL BREAKDOWN  Patient had a small amount of watery discharge but is decreasing otherwise done well.  Exam: Abdomen: Soft nontender no rebound or guarding Pelvic: Bartholin urethra Skene was within normal limits Vagina: No lesions or discharge Cervix: No lesions or discharge Uterus: Anteverted normal size shape and consistency Adnexa: No palpable masses or tenderness  Assessment/plan: Patient 2 weeks postop status post endometrial ablation for dysfunction uterine bleeding. Patient scheduled to return back to the office in the future for her annual exam. Patient has already resumed her full normal activity.

## 2011-12-27 ENCOUNTER — Ambulatory Visit: Payer: 59 | Admitting: Gynecology

## 2012-01-16 ENCOUNTER — Telehealth: Payer: Self-pay | Admitting: *Deleted

## 2012-01-16 NOTE — Telephone Encounter (Signed)
patient called in and requested that her day be changed to 02-14-2012 starting at 8:15am patient confirmed over the phone 01-16-2012

## 2012-02-06 ENCOUNTER — Ambulatory Visit: Payer: 59 | Admitting: Physician Assistant

## 2012-02-06 ENCOUNTER — Ambulatory Visit: Payer: 59

## 2012-02-06 ENCOUNTER — Other Ambulatory Visit: Payer: 59 | Admitting: Lab

## 2012-02-07 ENCOUNTER — Ambulatory Visit: Payer: 59 | Admitting: Physician Assistant

## 2012-02-07 ENCOUNTER — Ambulatory Visit: Payer: 59

## 2012-02-07 ENCOUNTER — Other Ambulatory Visit: Payer: 59 | Admitting: Lab

## 2012-02-14 ENCOUNTER — Other Ambulatory Visit (HOSPITAL_BASED_OUTPATIENT_CLINIC_OR_DEPARTMENT_OTHER): Payer: 59 | Admitting: Lab

## 2012-02-14 ENCOUNTER — Other Ambulatory Visit: Payer: 59 | Admitting: Lab

## 2012-02-14 ENCOUNTER — Ambulatory Visit: Payer: 59

## 2012-02-14 ENCOUNTER — Ambulatory Visit: Payer: 59 | Admitting: Physician Assistant

## 2012-02-14 ENCOUNTER — Telehealth: Payer: Self-pay | Admitting: *Deleted

## 2012-02-14 DIAGNOSIS — D649 Anemia, unspecified: Secondary | ICD-10-CM

## 2012-02-14 LAB — CBC & DIFF AND RETIC
Basophils Absolute: 0 10*3/uL (ref 0.0–0.1)
Eosinophils Absolute: 0.7 10*3/uL — ABNORMAL HIGH (ref 0.0–0.5)
HCT: 41.7 % (ref 34.8–46.6)
HGB: 14.1 g/dL (ref 11.6–15.9)
LYMPH%: 31 % (ref 14.0–49.7)
MCV: 84.8 fL (ref 79.5–101.0)
MONO#: 0.4 10*3/uL (ref 0.1–0.9)
MONO%: 6.7 % (ref 0.0–14.0)
NEUT#: 3.2 10*3/uL (ref 1.5–6.5)
Platelets: 235 10*3/uL (ref 145–400)
RBC: 4.92 10*6/uL (ref 3.70–5.45)
Retic %: 1.15 % (ref 0.70–2.10)
WBC: 6.3 10*3/uL (ref 3.9–10.3)

## 2012-02-14 NOTE — Telephone Encounter (Signed)
per patient having car problems moving md and iv iron to 02-25-2012 patient confirmed over the phone

## 2012-02-15 NOTE — Progress Notes (Signed)
Patient canceled appointment on 4/18 due to car trouble.  She will have labs only on 4/18 and will return on 02/25/12 for follow up visit and possible iron infusion.  Zollie Scale, PA-C 02/14/2012

## 2012-02-25 ENCOUNTER — Other Ambulatory Visit: Payer: 59 | Admitting: Lab

## 2012-02-25 ENCOUNTER — Telehealth: Payer: Self-pay | Admitting: Oncology

## 2012-02-25 ENCOUNTER — Ambulatory Visit: Payer: 59 | Admitting: Lab

## 2012-02-25 ENCOUNTER — Encounter: Payer: Self-pay | Admitting: Physician Assistant

## 2012-02-25 ENCOUNTER — Ambulatory Visit (HOSPITAL_BASED_OUTPATIENT_CLINIC_OR_DEPARTMENT_OTHER): Payer: 59

## 2012-02-25 ENCOUNTER — Other Ambulatory Visit: Payer: Self-pay | Admitting: *Deleted

## 2012-02-25 ENCOUNTER — Ambulatory Visit (HOSPITAL_BASED_OUTPATIENT_CLINIC_OR_DEPARTMENT_OTHER): Payer: 59 | Admitting: Physician Assistant

## 2012-02-25 DIAGNOSIS — N938 Other specified abnormal uterine and vaginal bleeding: Secondary | ICD-10-CM

## 2012-02-25 DIAGNOSIS — N920 Excessive and frequent menstruation with regular cycle: Secondary | ICD-10-CM

## 2012-02-25 DIAGNOSIS — D508 Other iron deficiency anemias: Secondary | ICD-10-CM

## 2012-02-25 DIAGNOSIS — Z8639 Personal history of other endocrine, nutritional and metabolic disease: Secondary | ICD-10-CM

## 2012-02-25 DIAGNOSIS — D649 Anemia, unspecified: Secondary | ICD-10-CM

## 2012-02-25 DIAGNOSIS — E611 Iron deficiency: Secondary | ICD-10-CM | POA: Insufficient documentation

## 2012-02-25 HISTORY — DX: Iron deficiency: E61.1

## 2012-02-25 LAB — COMPREHENSIVE METABOLIC PANEL
AST: 12 U/L (ref 0–37)
Albumin: 4.2 g/dL (ref 3.5–5.2)
Alkaline Phosphatase: 59 U/L (ref 39–117)
BUN: 9 mg/dL (ref 6–23)
Calcium: 9.3 mg/dL (ref 8.4–10.5)
Chloride: 105 mEq/L (ref 96–112)
Glucose, Bld: 104 mg/dL — ABNORMAL HIGH (ref 70–99)
Potassium: 4.1 mEq/L (ref 3.5–5.3)
Sodium: 142 mEq/L (ref 135–145)
Total Protein: 6.4 g/dL (ref 6.0–8.3)

## 2012-02-25 LAB — HEMOGLOBIN A1C: Mean Plasma Glucose: 105 mg/dL (ref <117)

## 2012-02-25 MED ORDER — SODIUM CHLORIDE 0.9 % IV SOLN
1020.0000 mg | Freq: Once | INTRAVENOUS | Status: AC
  Administered 2012-02-25: 1020 mg via INTRAVENOUS
  Filled 2012-02-25: qty 34

## 2012-02-25 NOTE — Progress Notes (Signed)
ID: Carolee Rota   DOB: 1961/06/09  MR#: [id number]  JYN#:[id number]  HISTORY OF PRESENT ILLNESS: Patient has a long-standing history of dysfunctional uterine bleeding with iron deficiency anemia, requiring IV iron infusions intermittently.  INTERVAL HISTORY: Layna returns today for routine followup of her iron deficiency and associated anemia. Interval history is remarkable for the fact that Claris Che underwent an endometrial ablation under the care of Dr. Lily Peer on 12/13/2011. She tolerated the procedure well. She had her first period since the procedure this past week which she describes as extremely light, and extremely brief.  Her energy level has begun to improve.   Antoine is concerned today about some recent glucose readings that were a little high. Her brother has a history of diabetes. She would very much like [place] to check a metabolic panel, and a hemoglobin A1c since she does not have an established primary care physician at this time. (She is in the process of establishing herself with a local family practice.)  She continues to be very busy. Her twin and 2 are now 58 or getting ready to graduate from high school and will be attending college in the fall. Her oldest daughter is in nursing school. Her youngest daughter is now 66.   REVIEW OF SYSTEMS: Physically, Nyaisha had few complaints today. She has had no recent illnesses and denies fevers, chills, night sweats, or unexplained weight loss. (She has been watching her diet and exercising regularly.) No rashes or abnormal bleeding. No nausea or change in bowel habits. No cough, shortness of breath, or chest pain. No abnormal headaches or dizziness. No unusual nodules or arthralgias.  A detailed review of systems is otherwise noncontributory.  PAST MEDICAL HISTORY: Past Medical History  Diagnosis Date  . Anemia     HISTORY OF  . Gallbladder disease     "GALL BLADDER DYSFUNCTION"  . Depression   . Iron deficiency 02/25/2012      PAST SURGICAL HISTORY: Past Surgical History  Procedure Date  . Cesarean section '83,'95,'97    WITH BTL  . Pylonidal cyst 1981  . Knee arthroscopy 04/27/2011    left   . Tubal ligation 1997    FAMILY HISTORY Family History  Problem Relation Age of Onset  . Stroke Mother   . Hypertension Father   . Cancer Father     PROSTATE  . Diabetes Brother     GYNECOLOGIC HISTORY: Menarche age: 10 OB History: G 5, P 5 (set of twins)   Previous GYN Procedures: 3 C-sections and one tubal ligation; status post endometrial ablation 12/13/2011.   SOCIAL HISTORY:    ADVANCED DIRECTIVES:  HEALTH MAINTENANCE: History  Substance Use Topics  . Smoking status: Never Smoker   . Smokeless tobacco: Never Used  . Alcohol Use: Yes     occ.     Colonoscopy:  PAP: 2012  Bone density:  Lipid panel:  No Known Allergies  Current Outpatient Prescriptions  Medication Sig Dispense Refill  . cetirizine (ZYRTEC) 10 MG tablet Take 10 mg by mouth daily.        . Multiple Vitamins-Iron (MULTIVITAMIN/IRON PO) Take by mouth.        . sertraline (ZOLOFT) 100 MG tablet Take 1 tablet (100 mg total) by mouth daily.  90 tablet  4   No current facility-administered medications for this visit.   Facility-Administered Medications Ordered in Other Visits  Medication Dose Route Frequency Provider Last Rate Last Dose  . ferumoxytol (FERAHEME) 1,020 mg in sodium chloride 0.9 %  100 mL IVPB  1,020 mg Intravenous Once Lowella Dell, MD        OBJECTIVE: Physical Exam: HEENT:  Sclerae anicteric, conjunctivae pink.  Oropharynx clear.     Nodes:  No cervical, supraclavicular, or axillary lymphadenopathy palpated.  Breast Exam:  Deferred Lungs:  Clear to auscultation bilaterally.  No crackles, rhonchi, or wheezes.   Heart:  Regular rate and rhythm.   Abdomen:  Soft, obese, nontender.  Positive bowel sounds.  No organomegaly or masses palpated.   Musculoskeletal:  No focal spinal tenderness to  palpation.  Extremities:  Benign.  No peripheral edema or cyanosis.   Skin:  Benign.   Neuro:  Nonfocal.    LAB RESULTS: Lab Results  Component Value Date   WBC 6.3 02/14/2012   NEUTROABS 3.2 02/14/2012   HGB 14.1 02/14/2012   HCT 41.7 02/14/2012   MCV 84.8 02/14/2012   PLT 235 02/14/2012    Ferritin on 02/14/12 was on the low end of normal at 35.  A CMET and hemoglobin A1C  Were drawn today, both results pending.  STUDIES: No results found.  ASSESSMENT: A 51 year old [place] woman with history of iron deficiency anemia secondary to menorrhagia and requiring IV iron infusions intermittently.    PLAN: Tonie will proceed to treatment today as scheduled for IV iron. We will repeat her labs in 6 weeks, and she will return in 3 months for labs and followup with Dr. Darnelle Catalan.  Taquisha will call in the meanwhile with any changes or problems.  Lesieli Bresee    02/25/2012

## 2012-02-25 NOTE — Telephone Encounter (Signed)
Pt has her June,july 2013 appts

## 2012-02-26 ENCOUNTER — Ambulatory Visit: Payer: 59 | Admitting: Physician Assistant

## 2012-02-26 ENCOUNTER — Ambulatory Visit: Payer: 59

## 2012-04-08 ENCOUNTER — Other Ambulatory Visit (HOSPITAL_BASED_OUTPATIENT_CLINIC_OR_DEPARTMENT_OTHER): Payer: 59 | Admitting: Lab

## 2012-04-08 DIAGNOSIS — D649 Anemia, unspecified: Secondary | ICD-10-CM

## 2012-04-08 LAB — CBC & DIFF AND RETIC
BASO%: 0.4 % (ref 0.0–2.0)
EOS%: 6.2 % (ref 0.0–7.0)
HCT: 40.4 % (ref 34.8–46.6)
LYMPH%: 30.8 % (ref 14.0–49.7)
MCH: 29.9 pg (ref 25.1–34.0)
MCHC: 33.9 g/dL (ref 31.5–36.0)
NEUT%: 54.7 % (ref 38.4–76.8)
Platelets: 200 10*3/uL (ref 145–400)
RBC: 4.58 10*6/uL (ref 3.70–5.45)
Retic %: 1.77 % (ref 0.70–2.10)

## 2012-04-28 ENCOUNTER — Other Ambulatory Visit: Payer: Self-pay | Admitting: Gynecology

## 2012-04-28 DIAGNOSIS — Z1231 Encounter for screening mammogram for malignant neoplasm of breast: Secondary | ICD-10-CM

## 2012-04-29 ENCOUNTER — Telehealth: Payer: Self-pay | Admitting: *Deleted

## 2012-04-29 NOTE — Telephone Encounter (Signed)
left patient voice message to inform the patient of the new date and time with dr. rubin on 07-24-2012 starting at 1:00pm with labs 

## 2012-05-05 ENCOUNTER — Other Ambulatory Visit: Payer: 59 | Admitting: Lab

## 2012-05-13 ENCOUNTER — Ambulatory Visit: Payer: 59 | Admitting: Oncology

## 2012-05-19 ENCOUNTER — Other Ambulatory Visit: Payer: 59 | Admitting: Lab

## 2012-05-20 ENCOUNTER — Other Ambulatory Visit: Payer: 59 | Admitting: Lab

## 2012-05-21 ENCOUNTER — Other Ambulatory Visit: Payer: 59 | Admitting: Lab

## 2012-05-21 ENCOUNTER — Ambulatory Visit: Payer: 59

## 2012-05-26 ENCOUNTER — Ambulatory Visit: Payer: 59 | Admitting: Oncology

## 2012-05-27 ENCOUNTER — Ambulatory Visit (HOSPITAL_BASED_OUTPATIENT_CLINIC_OR_DEPARTMENT_OTHER): Payer: 59

## 2012-05-27 ENCOUNTER — Telehealth: Payer: Self-pay | Admitting: *Deleted

## 2012-05-27 NOTE — Telephone Encounter (Signed)
patient called in patient stated she forgot about the lab appointment placed patient on the walk in list for 05-27-2012 at 4:00pm

## 2012-05-29 ENCOUNTER — Other Ambulatory Visit: Payer: 59 | Admitting: Lab

## 2012-05-29 ENCOUNTER — Ambulatory Visit
Admission: RE | Admit: 2012-05-29 | Discharge: 2012-05-29 | Disposition: A | Discharge status: Home / Self Care | Payer: 59 | Source: Ambulatory Visit | Attending: Gynecology | Admitting: Gynecology

## 2012-05-29 ENCOUNTER — Ambulatory Visit (HOSPITAL_BASED_OUTPATIENT_CLINIC_OR_DEPARTMENT_OTHER): Payer: 59 | Admitting: Oncology

## 2012-05-29 VITALS — BP 116/71 | HR 69 | Temp 98.4°F | Ht 68.0 in | Wt 228.7 lb

## 2012-05-29 DIAGNOSIS — N920 Excessive and frequent menstruation with regular cycle: Secondary | ICD-10-CM

## 2012-05-29 DIAGNOSIS — Z1231 Encounter for screening mammogram for malignant neoplasm of breast: Secondary | ICD-10-CM

## 2012-05-29 DIAGNOSIS — D509 Iron deficiency anemia, unspecified: Secondary | ICD-10-CM

## 2012-05-29 DIAGNOSIS — D649 Anemia, unspecified: Secondary | ICD-10-CM

## 2012-05-29 DIAGNOSIS — D500 Iron deficiency anemia secondary to blood loss (chronic): Secondary | ICD-10-CM

## 2012-05-29 LAB — CBC & DIFF AND RETIC
Basophils Absolute: 0 10*3/uL (ref 0.0–0.1)
EOS%: 4 % (ref 0.0–7.0)
HCT: 41.9 % (ref 34.8–46.6)
HGB: 14.5 g/dL (ref 11.6–15.9)
LYMPH%: 33.5 % (ref 14.0–49.7)
MCH: 30.3 pg (ref 25.1–34.0)
MCV: 87.7 fL (ref 79.5–101.0)
MONO%: 8 % (ref 0.0–14.0)
NEUT%: 54.2 % (ref 38.4–76.8)
Platelets: 204 10*3/uL (ref 145–400)
Retic Ct Abs: 72.66 10*3/uL (ref 33.70–90.70)

## 2012-05-29 NOTE — Progress Notes (Signed)
ID: Tara Duffy   DOB: November 12, 1960  MR#: [id number]  WUJ#:[id number]  INTERVAL HISTORY: Tara Duffy returns for routine followup of her iron deficiency anemia. Since the last visit here she had endometrial ablation under Dr. Lily Peer, and she tells me her periods now are very scant, although still regular. She does not even have to wear a tampon most of the time.  REVIEW OF SYSTEMS: She has problems from psoriasis, occasional joint pain, feels forgetful, but the biggest problem is that her mother is currently under hospice care, and her brother in [place] has just found to have liver metastatic disease. This is causing significant stress. Otherwise a detailed review of systems today was noncontributory  PAST MEDICAL HISTORY: Past Medical History  Diagnosis Date  . Anemia     HISTORY OF  . Gallbladder disease     "GALL BLADDER DYSFUNCTION"  . Depression   . Iron deficiency 02/25/2012    PAST SURGICAL HISTORY: Past Surgical History  Procedure Date  . Cesarean section '83,'95,'97    WITH BTL  . Pylonidal cyst 1981  . Knee arthroscopy 04/27/2011    left   . Tubal ligation 1997    FAMILY HISTORY Family History  Problem Relation Age of Onset  . Stroke Mother   . Hypertension Father   . Cancer Father     PROSTATE  . Diabetes Brother     HEALTH MAINTENANCE: History  Substance Use Topics  . Smoking status: Never Smoker   . Smokeless tobacco: Never Used  . Alcohol Use: Yes     occ.     Colonoscopy:  PAP:  Bone density:  Lipid panel:  No Known Allergies  Current Outpatient Prescriptions  Medication Sig Dispense Refill  . cetirizine (ZYRTEC) 10 MG tablet Take 10 mg by mouth daily.        . Multiple Vitamins-Iron (MULTIVITAMIN/IRON PO) Take by mouth.        . sertraline (ZOLOFT) 100 MG tablet Take 1 tablet (100 mg total) by mouth daily.  90 tablet  4    OBJECTIVE: Middle-aged white woman in no acute distress Filed Vitals:   05/29/12 1040  BP: 116/71  Pulse: 69    Temp: 98.4 F (36.9 C)     Body mass index is 34.77 kg/(m^2).    ECOG FS: 0  Sclerae unicteric Oropharynx clear No cervical or supraclavicular adenopathy Lungs no rales or rhonchi Heart regular rate and rhythm Abd benign MSK no focal spinal tenderness, no peripheral edema Neuro: nonfocal Breasts: Deferred  LAB RESULTS: Lab Results  Component Value Date   WBC 6.5 05/29/2012   NEUTROABS 3.5 05/29/2012   HGB 14.5 05/29/2012   HCT 41.9 05/29/2012   MCV 87.7 05/29/2012   PLT 204 05/29/2012      Chemistry      Component Value Date/Time   NA 142 02/25/2012 1002   K 4.1 02/25/2012 1002   CL 105 02/25/2012 1002   CO2 28 02/25/2012 1002   BUN 9 02/25/2012 1002   CREATININE 0.80 02/25/2012 1002      Component Value Date/Time   CALCIUM 9.3 02/25/2012 1002   ALKPHOS 59 02/25/2012 1002   AST 12 02/25/2012 1002   ALT 8 02/25/2012 1002   BILITOT 0.4 02/25/2012 1002       No results found for this basename: LABCA2    No components found with this basename: [id number]    No results found for this basename: INR:1;PROTIME:1 in the last 168 hours  Urinalysis  Component Value Date/Time   COLORURINE YELLOW 12/13/2011 1139   APPEARANCEUR CLEAR 12/13/2011 1139   LABSPEC >1.030* 12/13/2011 1139   PHURINE 6.0 12/13/2011 1139   GLUCOSEU NEGATIVE 12/13/2011 1139   HGBUR SMALL* 12/13/2011 1139   BILIRUBINUR NEGATIVE 12/13/2011 1139   KETONESUR NEGATIVE 12/13/2011 1139   PROTEINUR NEGATIVE 12/13/2011 1139   UROBILINOGEN 0.2 12/13/2011 1139   NITRITE NEGATIVE 12/13/2011 1139   LEUKOCYTESUR NEGATIVE 12/13/2011 1139    STUDIES: No results found.  ASSESSMENT: 51 y.o. [place] woman with history of iron deficiency anemia secondary to menorrhagia and requiring IV iron infusions intermittently, most recently 02/25/2012   PLAN: I think now that Sabria is no longer having menorrhagia she is unlikely to become iron deficient again. I am very comfortable releasing her to her primary care physician's care. She  knows that we will be glad to re- check her hemoglobin and ferritin and give her Feraheme in the future if the need were to arise. As of now however no further appointments are being made for her here.  Foch Rosenwald C    05/29/2012

## 2012-06-11 ENCOUNTER — Ambulatory Visit: Payer: 59 | Admitting: Psychiatry

## 2012-06-11 NOTE — Progress Notes (Signed)
06-11-2012  Patient seen for initial psychological evaluation.  Next appointment 06-12-2012.

## 2012-06-12 ENCOUNTER — Ambulatory Visit (INDEPENDENT_AMBULATORY_CARE_PROVIDER_SITE_OTHER): Payer: 59 | Admitting: Psychiatry

## 2012-06-12 DIAGNOSIS — F4323 Adjustment disorder with mixed anxiety and depressed mood: Secondary | ICD-10-CM

## 2012-06-12 NOTE — Progress Notes (Signed)
06-12-2012  Patient seen for individual psychotherapy.  Next appointment 06-17-2012.

## 2012-06-16 ENCOUNTER — Ambulatory Visit: Payer: 59 | Admitting: Psychiatry

## 2012-06-17 ENCOUNTER — Ambulatory Visit: Payer: 59 | Admitting: Psychiatry

## 2012-06-18 ENCOUNTER — Ambulatory Visit (INDEPENDENT_AMBULATORY_CARE_PROVIDER_SITE_OTHER): Payer: 59 | Admitting: Psychiatry

## 2012-06-18 DIAGNOSIS — F4323 Adjustment disorder with mixed anxiety and depressed mood: Secondary | ICD-10-CM

## 2012-06-23 ENCOUNTER — Ambulatory Visit (INDEPENDENT_AMBULATORY_CARE_PROVIDER_SITE_OTHER): Payer: 59 | Admitting: Psychiatry

## 2012-06-23 DIAGNOSIS — F4323 Adjustment disorder with mixed anxiety and depressed mood: Secondary | ICD-10-CM

## 2012-07-01 ENCOUNTER — Ambulatory Visit: Payer: 59 | Admitting: Psychiatry

## 2012-09-03 ENCOUNTER — Ambulatory Visit (INDEPENDENT_AMBULATORY_CARE_PROVIDER_SITE_OTHER): Payer: 59 | Admitting: Psychiatry

## 2012-09-03 DIAGNOSIS — F4323 Adjustment disorder with mixed anxiety and depressed mood: Secondary | ICD-10-CM

## 2012-11-10 ENCOUNTER — Encounter: Payer: Self-pay | Admitting: Women's Health

## 2012-11-10 ENCOUNTER — Ambulatory Visit (INDEPENDENT_AMBULATORY_CARE_PROVIDER_SITE_OTHER): Payer: BC Managed Care – PPO | Admitting: Women's Health

## 2012-11-10 VITALS — BP 116/74 | Ht 67.5 in | Wt 224.5 lb

## 2012-11-10 DIAGNOSIS — N938 Other specified abnormal uterine and vaginal bleeding: Secondary | ICD-10-CM

## 2012-11-10 DIAGNOSIS — F411 Generalized anxiety disorder: Secondary | ICD-10-CM

## 2012-11-10 DIAGNOSIS — F419 Anxiety disorder, unspecified: Secondary | ICD-10-CM

## 2012-11-10 DIAGNOSIS — Z01419 Encounter for gynecological examination (general) (routine) without abnormal findings: Secondary | ICD-10-CM

## 2012-11-10 DIAGNOSIS — Z113 Encounter for screening for infections with a predominantly sexual mode of transmission: Secondary | ICD-10-CM

## 2012-11-10 DIAGNOSIS — N949 Unspecified condition associated with female genital organs and menstrual cycle: Secondary | ICD-10-CM

## 2012-11-10 LAB — CBC WITH DIFFERENTIAL/PLATELET
Eosinophils Relative: 11 % — ABNORMAL HIGH (ref 0–5)
HCT: 44.4 % (ref 36.0–46.0)
Hemoglobin: 15.1 g/dL — ABNORMAL HIGH (ref 12.0–15.0)
Lymphocytes Relative: 30 % (ref 12–46)
Lymphs Abs: 2.3 10*3/uL (ref 0.7–4.0)
MCV: 89.2 fL (ref 78.0–100.0)
Platelets: 253 10*3/uL (ref 150–400)
RBC: 4.98 MIL/uL (ref 3.87–5.11)
WBC: 7.8 10*3/uL (ref 4.0–10.5)

## 2012-11-10 MED ORDER — SERTRALINE HCL 100 MG PO TABS: 100.0000 mg | ORAL_TABLET | Freq: Every day | ORAL | Status: DC

## 2012-11-10 NOTE — Patient Instructions (Signed)

## 2012-11-10 NOTE — Assessment & Plan Note (Signed)
D&C with  ablation 11/2011

## 2012-11-10 NOTE — Progress Notes (Signed)
Tara Duffy 08-09-1961 [id number]    History:    The patient presents for annual exam.  History of a D&C with ablation February 2013 per Dr. Lily Peer, cycles became lighter, September last cycle. Reports minimal menopausal type symptoms. BTL. History of depression, labs stable on Zoloft. New partner. Has lost 40 pounds in the past year with diet and exercise. History of normal Paps and mammograms. History of iron deficiency anemia requiring transfusions Dr.Magrinat/ hematologist.   Past medical history, past surgical history, family history and social history were all reviewed and documented in the EPIC chart. 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 Lolita 16 [employer], twins 18 daughter [facility] plane [employer], son [employer].   ROS:  A  ROS was performed and pertinent positives and negatives are included in the history.  Exam:  Filed Vitals:   11/10/12 1150  BP: 116/74    General appearance:  Normal Head/Neck:  Normal, without cervical or supraclavicular adenopathy. Thyroid:  Symmetrical, normal in size, without palpable masses or nodularity. Respiratory  Effort:  Normal  Auscultation:  Clear without wheezing or rhonchi Cardiovascular  Auscultation:  Regular rate, without rubs, murmurs or gallops  Edema/varicosities:  Not grossly evident Abdominal  Soft,nontender, without masses, guarding or rebound.  Liver/spleen:  No organomegaly noted  Hernia:  None appreciated  Skin  Inspection:  Grossly normal  Palpation:  Grossly normal Neurologic/psychiatric  Orientation:  Normal with appropriate conversation.  Mood/affect:  Normal  Genitourinary    Breasts: Examined lying and sitting.     Right: Without masses, retractions, discharge or axillary adenopathy.     Left: Without masses, retractions, discharge or axillary adenopathy.   Inguinal/mons:  Normal without inguinal adenopathy  External genitalia:  Normal  BUS/Urethra/Skene's glands:  Normal  Bladder:  Normal  Vagina:   Normal  Cervix:  Normal  Uterus:   normal in size, shape and contour.  Midline and mobile  Adnexa/parametria:     Rt: Without masses or tenderness.   Lt: Without masses or tenderness.  Anus and perineum: Normal  Digital rectal exam: Normal sphincter tone without palpated masses or tenderness  Assessment/Plan:  52 y.o. D. WF G5 P5  for annual exam with no complaints.  Depression stable Zoloft Amenorrhea/ablation 11/2011 STD screen  Plan: Menopause reviewed, denies symptoms. SBE's, annual mammogram, calcium rich diet, vitamin D 2000 daily encouraged. Reviewed importance of continuing healthy exercise and decreasing calories for continued weight loss. Zoloft 100 daily prescription, proper use given and reviewed. CBC, UA, GC/Chlamydia, HIV, hep B, C., RPR. Pap normal 2012, new screening guidelines reviewed. Screening colonoscopy reviewed, instructed to schedule.    Harrington Challenger WHNP, 1:09 PM 11/10/2012

## 2012-11-11 LAB — URINALYSIS W MICROSCOPIC + REFLEX CULTURE
Casts: NONE SEEN
Crystals: NONE SEEN
Ketones, ur: NEGATIVE mg/dL
Leukocytes, UA: NEGATIVE
Nitrite: NEGATIVE
Specific Gravity, Urine: 1.014 (ref 1.005–1.030)
pH: 7 (ref 5.0–8.0)

## 2012-11-11 LAB — HEPATITIS C ANTIBODY: HCV Ab: NEGATIVE

## 2012-11-12 LAB — URINE CULTURE

## 2013-05-26 ENCOUNTER — Other Ambulatory Visit: Payer: Self-pay

## 2013-05-26 DIAGNOSIS — Z1231 Encounter for screening mammogram for malignant neoplasm of breast: Secondary | ICD-10-CM

## 2013-06-12 ENCOUNTER — Ambulatory Visit: Payer: BC Managed Care – PPO

## 2013-06-23 ENCOUNTER — Ambulatory Visit: Payer: BC Managed Care – PPO

## 2013-07-01 ENCOUNTER — Ambulatory Visit
Admission: RE | Admit: 2013-07-01 | Discharge: 2013-07-01 | Disposition: A | Discharge status: Home / Self Care | Payer: BC Managed Care – PPO | Source: Ambulatory Visit

## 2013-07-01 DIAGNOSIS — Z1231 Encounter for screening mammogram for malignant neoplasm of breast: Secondary | ICD-10-CM

## 2013-11-11 ENCOUNTER — Ambulatory Visit (INDEPENDENT_AMBULATORY_CARE_PROVIDER_SITE_OTHER): Payer: Commercial Managed Care - PPO | Admitting: Women's Health

## 2013-11-11 ENCOUNTER — Encounter: Payer: Self-pay | Admitting: Women's Health

## 2013-11-11 VITALS — BP 114/78 | Ht 67.5 in | Wt 214.0 lb

## 2013-11-11 DIAGNOSIS — N760 Acute vaginitis: Secondary | ICD-10-CM

## 2013-11-11 DIAGNOSIS — F419 Anxiety disorder, unspecified: Secondary | ICD-10-CM

## 2013-11-11 DIAGNOSIS — Z13220 Encounter for screening for lipoid disorders: Secondary | ICD-10-CM

## 2013-11-11 DIAGNOSIS — A499 Bacterial infection, unspecified: Secondary | ICD-10-CM

## 2013-11-11 DIAGNOSIS — Z01419 Encounter for gynecological examination (general) (routine) without abnormal findings: Secondary | ICD-10-CM

## 2013-11-11 DIAGNOSIS — Z833 Family history of diabetes mellitus: Secondary | ICD-10-CM

## 2013-11-11 DIAGNOSIS — F411 Generalized anxiety disorder: Secondary | ICD-10-CM

## 2013-11-11 DIAGNOSIS — B9689 Other specified bacterial agents as the cause of diseases classified elsewhere: Secondary | ICD-10-CM

## 2013-11-11 LAB — LIPID PANEL
Cholesterol: 183 mg/dL (ref 0–200)
HDL: 43 mg/dL (ref >39)
LDL CALC: 123 mg/dL — AB (ref 0–99)
Total CHOL/HDL Ratio: 4.3 Ratio
Triglycerides: 87 mg/dL (ref <150)
VLDL: 17 mg/dL (ref 0–40)

## 2013-11-11 LAB — CBC WITH DIFFERENTIAL/PLATELET
BASOS ABS: 0 10*3/uL (ref 0.0–0.1)
BASOS PCT: 1 % (ref 0–1)
EOS ABS: 0.5 10*3/uL (ref 0.0–0.7)
Eosinophils Relative: 10 % — ABNORMAL HIGH (ref 0–5)
HEMATOCRIT: 41.9 % (ref 36.0–46.0)
HEMOGLOBIN: 14.2 g/dL (ref 12.0–15.0)
Lymphocytes Relative: 35 % (ref 12–46)
Lymphs Abs: 1.7 10*3/uL (ref 0.7–4.0)
MCH: 30 pg (ref 26.0–34.0)
MCHC: 33.9 g/dL (ref 30.0–36.0)
MCV: 88.4 fL (ref 78.0–100.0)
MONO ABS: 0.3 10*3/uL (ref 0.1–1.0)
MONOS PCT: 5 % (ref 3–12)
Neutro Abs: 2.4 10*3/uL (ref 1.7–7.7)
Neutrophils Relative %: 49 % (ref 43–77)
Platelets: 250 10*3/uL (ref 150–400)
RBC: 4.74 MIL/uL (ref 3.87–5.11)
RDW: 14.3 % (ref 11.5–15.5)
WBC: 4.9 10*3/uL (ref 4.0–10.5)

## 2013-11-11 LAB — WET PREP FOR TRICH, YEAST, CLUE
TRICH WET PREP: NONE SEEN
WBC WET PREP: NONE SEEN
YEAST WET PREP: NONE SEEN

## 2013-11-11 LAB — GLUCOSE, RANDOM: Glucose, Bld: 83 mg/dL (ref 70–99)

## 2013-11-11 MED ORDER — METRONIDAZOLE 0.75 % VA GEL: VAGINAL | Status: DC

## 2013-11-11 MED ORDER — SERTRALINE HCL 100 MG PO TABS: 100.0000 mg | ORAL_TABLET | Freq: Every day | ORAL | Status: DC

## 2013-11-11 NOTE — Patient Instructions (Signed)

## 2013-11-11 NOTE — Progress Notes (Signed)
Tara Duffy [birth year] [id number]    History:    The patient presents for annual exam.  Last menstrual cycle September 2014 . History of a D&C with ablation February 2013 per Dr. Toney Rakes. Reports minimal menopausal type symptoms. BTL. Not sexually active/condoms. History of depression, stable on Zoloft.  History of iron deficiency anemia requiring transfusions Dr.Magrinat/ hematologist. Zumba three days a week, 10 pound weight loss over past year.   Past medical history, past surgical history, family history and social history were all reviewed and documented in the EPIC chart. 5 children, 3 granddaughters, all doing well. Nurse in the cardiac cath lab.   ROS:  A  ROS was performed and pertinent positives and negatives are included in the history.  Exam:  Filed Vitals:   11/11/13 0928  BP: 114/78    General appearance:  Normal Head/Neck:  Normal, without cervical or supraclavicular adenopathy. Thyroid:  Symmetrical, normal in size, without palpable masses or nodularity. Respiratory  Effort:  Normal  Auscultation:  Clear without wheezing or rhonchi Cardiovascular  Auscultation:  Regular rate, without rubs, murmurs or gallops  Edema/varicosities:  Not grossly evident Abdominal  Soft,nontender, without masses, guarding or rebound.  Liver/spleen:  No organomegaly noted  Hernia:  None appreciated  Skin  Inspection:  Grossly normal, some loss of pigmentation  Palpation:  Grossly normal Neurologic/psychiatric  Orientation:  Normal with appropriate conversation.  Mood/affect:  Normal  Genitourinary    Breasts: Examined lying and sitting.     Right: Without masses, retractions, discharge or axillary adenopathy.     Left: Without masses, retractions, discharge or axillary adenopathy.   Inguinal/mons:  Normal without inguinal adenopathy  External genitalia:  Normal  BUS/Urethra/Skene's glands:  Normal  Bladder:  Normal  Vagina:  Normal, white discharge present, wet prep  positive for amine, clues and bacteria  Cervix:  Normal  Uterus:  Normal in size, shape and contour.  Midline and mobile  Adnexa/parametria:     Rt: Without masses or tenderness.   Lt: Without masses or tenderness.  Anus and perineum: Normal  Digital rectal exam: Normal sphincter tone without palpated masses or tenderness  Assessment/Plan:  53 y.o.   [name] G4 P5  for annual exam with no complaints.  Depression stable Zoloft Amenorrhea/ablation 11/2011 Anemia / managed hematologist Bacterial vaginosis  Plan:. MetroGel vaginal x 5. Reviewed importantance of alcohol avoidance. Zoloft 100 mg po once daily. Prescription proper use given and reviewed. Counseling in the past denies need for at this time. CBC, Glucose, Lipid panel, UA, Pap normal 2012, new screening guidelines reviewed. SBE's, continue annual mammogram, calcium rich diet, vitamin D 2000 daily encouraged. Reviewed importance of continuing healthy exercise and decreasing calories for continued weight loss (10 pound weight loss from last year/ 50 total).  Screening colonoscopy reviewed, instructed to schedule.     Huel Cote [facility], 10:19 AM 11/11/2013

## 2013-11-12 LAB — URINALYSIS W MICROSCOPIC + REFLEX CULTURE
Bilirubin Urine: NEGATIVE
Casts: NONE SEEN
Crystals: NONE SEEN
Glucose, UA: NEGATIVE mg/dL
HGB URINE DIPSTICK: NEGATIVE
Ketones, ur: NEGATIVE mg/dL
NITRITE: NEGATIVE
Protein, ur: NEGATIVE mg/dL
Specific Gravity, Urine: 1.01 (ref 1.005–1.030)
Urobilinogen, UA: 0.2 mg/dL (ref 0.0–1.0)
pH: 7.5 (ref 5.0–8.0)

## 2013-11-13 LAB — URINE CULTURE
Colony Count: NO GROWTH
Organism ID, Bacteria: NO GROWTH

## 2013-11-18 ENCOUNTER — Other Ambulatory Visit: Payer: Self-pay | Admitting: Women's Health

## 2013-11-18 ENCOUNTER — Telehealth: Payer: Self-pay | Admitting: *Deleted

## 2013-11-18 DIAGNOSIS — B029 Zoster without complications: Secondary | ICD-10-CM

## 2013-11-18 MED ORDER — VALACYCLOVIR HCL 1 G PO TABS
ORAL_TABLET | ORAL | Status: DC
Start: 2013-11-18 — End: 2014-06-30

## 2013-11-18 NOTE — Telephone Encounter (Signed)
Telephone call, states has had unilateral pain at bra line for several days, no rash until today, 2 small vesicles noted. 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 History of chickenpox as a child. Reviewed shingles is usually clinically diagnosed with symptoms. Will try Valtrex 1000 3 times daily for 7 days. Instructed to call if no relief of symptoms.

## 2013-11-18 NOTE — Telephone Encounter (Signed)
Pt called c/o possible shingles, c/o nerve pain around her bra line, no outbreak. Pt # [phone] please advise

## 2013-11-24 NOTE — Telephone Encounter (Signed)
Telephone call for followup, states it is better, had increased breakout same day called for Valtrex. States  pain is minimal, but still there.

## 2014-06-30 ENCOUNTER — Encounter: Payer: Self-pay | Admitting: Gynecology

## 2014-06-30 ENCOUNTER — Ambulatory Visit (INDEPENDENT_AMBULATORY_CARE_PROVIDER_SITE_OTHER): Payer: BC Managed Care – PPO | Admitting: Gynecology

## 2014-06-30 DIAGNOSIS — N3000 Acute cystitis without hematuria: Secondary | ICD-10-CM

## 2014-06-30 DIAGNOSIS — R300 Dysuria: Secondary | ICD-10-CM

## 2014-06-30 LAB — URINALYSIS W MICROSCOPIC + REFLEX CULTURE
BILIRUBIN URINE: NEGATIVE
Casts: NONE SEEN
Crystals: NONE SEEN
Glucose, UA: NEGATIVE mg/dL
Ketones, ur: NEGATIVE mg/dL
Nitrite: NEGATIVE
PROTEIN: NEGATIVE mg/dL
Specific Gravity, Urine: <1.005 — ABNORMAL LOW (ref 1.005–1.030)
UROBILINOGEN UA: 0.2 mg/dL (ref 0.0–1.0)
pH: 5.5 (ref 5.0–8.0)

## 2014-06-30 MED ORDER — SULFAMETHOXAZOLE-TMP DS 800-160 MG PO TABS
1.0000 | ORAL_TABLET | Freq: Two times a day (BID) | ORAL | Status: DC
Start: 2014-06-30 — End: 2014-12-07

## 2014-06-30 NOTE — Patient Instructions (Signed)
Take antibiotic twice daily for 3 days. Followup if your urinary symptoms continue, worsen or recur.  Urinary Tract Infection Urinary tract infections (UTIs) can develop anywhere along your urinary tract. Your urinary tract is your body's drainage system for removing wastes and extra water. Your urinary tract includes two kidneys, two ureters, a bladder, and a urethra. Your kidneys are a pair of bean-shaped organs. Each kidney is about the size of your fist. They are located below your ribs, one on each side of your spine. CAUSES Infections are caused by microbes, which are microscopic organisms, including fungi, viruses, and bacteria. These organisms are so small that they can only be seen through a microscope. Bacteria are the microbes that most commonly cause UTIs. SYMPTOMS  Symptoms of UTIs may vary by age and gender of the patient and by the location of the infection. Symptoms in young women typically include a frequent and intense urge to urinate and a painful, burning feeling in the bladder or urethra during urination. Older women and men are more likely to be tired, shaky, and weak and have muscle aches and abdominal pain. A fever may mean the infection is in your kidneys. Other symptoms of a kidney infection include pain in your back or sides below the ribs, nausea, and vomiting. DIAGNOSIS To diagnose a UTI, your caregiver will ask you about your symptoms. Your caregiver also will ask to provide a urine sample. The urine sample will be tested for bacteria and white blood cells. White blood cells are made by your body to help fight infection. TREATMENT  Typically, UTIs can be treated with medication. Because most UTIs are caused by a bacterial infection, they usually can be treated with the use of antibiotics. The choice of antibiotic and length of treatment depend on your symptoms and the type of bacteria causing your infection. HOME CARE INSTRUCTIONS  If you were prescribed antibiotics, take  them exactly as your caregiver instructs you. Finish the medication even if you feel better after you have only taken some of the medication.  Drink enough water and fluids to keep your urine clear or pale yellow.  Avoid caffeine, tea, and carbonated beverages. They tend to irritate your bladder.  Empty your bladder often. Avoid holding urine for long periods of time.  Empty your bladder before and after sexual intercourse.  After a bowel movement, women should cleanse from front to back. Use each tissue only once. SEEK MEDICAL CARE IF:   You have back pain.  You develop a fever.  Your symptoms do not begin to resolve within 3 days. SEEK IMMEDIATE MEDICAL CARE IF:   You have severe back pain or lower abdominal pain.  You develop chills.  You have nausea or vomiting.  You have continued burning or discomfort with urination. MAKE SURE YOU:   Understand these instructions.  Will watch your condition.  Will get help right away if you are not doing well or get worse. Document Released: 07/25/2005 Document Revised: 04/15/2012 Document Reviewed: 11/23/2011 [facility] Patient Information 2015 [place], [place]. This information is not intended to replace advice given to you by your health care provider. Make sure you discuss any questions you have with your health care provider.

## 2014-06-30 NOTE — Progress Notes (Signed)
Patient presented complaining of frequency and dysuria. Left a urine specimen consistent with UTI showing 21-50 WBC TNTC RBC and few bacteria. She unfortunately left before I could see her because of another appointment and her appointment in our office was delayed due to her not having insurance information and having to retrieve it. I will treat her with Septra DS one by mouth twice a day x3 days and office staff will call her to let her another prescription is available. She will followup if her symptoms persist, worsen or recur.

## 2014-07-01 LAB — URINE CULTURE
Colony Count: NO GROWTH
Organism ID, Bacteria: NO GROWTH

## 2014-07-02 ENCOUNTER — Other Ambulatory Visit: Payer: Self-pay | Admitting: Gynecology

## 2014-07-02 DIAGNOSIS — N3001 Acute cystitis with hematuria: Secondary | ICD-10-CM

## 2014-08-13 ENCOUNTER — Other Ambulatory Visit: Payer: BC Managed Care – PPO

## 2014-08-13 DIAGNOSIS — N3001 Acute cystitis with hematuria: Secondary | ICD-10-CM

## 2014-08-14 LAB — URINALYSIS W MICROSCOPIC + REFLEX CULTURE
BILIRUBIN URINE: NEGATIVE
Bacteria, UA: NONE SEEN
Casts: NONE SEEN
Crystals: NONE SEEN
Glucose, UA: NEGATIVE mg/dL
HGB URINE DIPSTICK: NEGATIVE
Ketones, ur: NEGATIVE mg/dL
Leukocytes, UA: NEGATIVE
Nitrite: NEGATIVE
PROTEIN: NEGATIVE mg/dL
Specific Gravity, Urine: 1.01 (ref 1.005–1.030)
UROBILINOGEN UA: 0.2 mg/dL (ref 0.0–1.0)
pH: 6.5 (ref 5.0–8.0)

## 2014-08-30 ENCOUNTER — Encounter: Payer: Self-pay | Admitting: Gynecology

## 2014-10-18 ENCOUNTER — Other Ambulatory Visit: Payer: Self-pay

## 2014-10-18 DIAGNOSIS — F419 Anxiety disorder, unspecified: Secondary | ICD-10-CM

## 2014-10-18 MED ORDER — SERTRALINE HCL 100 MG PO TABS: 100.0000 mg | ORAL_TABLET | Freq: Every day | ORAL | Status: DC

## 2014-11-25 ENCOUNTER — Other Ambulatory Visit: Payer: Self-pay

## 2014-11-25 DIAGNOSIS — Z1231 Encounter for screening mammogram for malignant neoplasm of breast: Secondary | ICD-10-CM

## 2014-12-06 ENCOUNTER — Ambulatory Visit
Admission: RE | Admit: 2014-12-06 | Discharge: 2014-12-06 | Disposition: A | Discharge status: Home / Self Care | Payer: BLUE CROSS/BLUE SHIELD | Source: Ambulatory Visit

## 2014-12-06 DIAGNOSIS — Z1231 Encounter for screening mammogram for malignant neoplasm of breast: Secondary | ICD-10-CM

## 2014-12-07 ENCOUNTER — Other Ambulatory Visit (HOSPITAL_COMMUNITY)
Admission: RE | Admit: 2014-12-07 | Discharge: 2014-12-07 | Disposition: A | Discharge status: Home / Self Care | Payer: BLUE CROSS/BLUE SHIELD | Source: Ambulatory Visit | Attending: Women's Health | Admitting: Women's Health

## 2014-12-07 ENCOUNTER — Encounter: Payer: Self-pay | Admitting: Women's Health

## 2014-12-07 ENCOUNTER — Ambulatory Visit (INDEPENDENT_AMBULATORY_CARE_PROVIDER_SITE_OTHER): Payer: BLUE CROSS/BLUE SHIELD | Admitting: Women's Health

## 2014-12-07 VITALS — BP 120/74 | Ht 67.0 in | Wt 224.0 lb

## 2014-12-07 DIAGNOSIS — Z1151 Encounter for screening for human papillomavirus (HPV): Secondary | ICD-10-CM | POA: Diagnosis present

## 2014-12-07 DIAGNOSIS — Z113 Encounter for screening for infections with a predominantly sexual mode of transmission: Secondary | ICD-10-CM

## 2014-12-07 DIAGNOSIS — Z01411 Encounter for gynecological examination (general) (routine) with abnormal findings: Secondary | ICD-10-CM | POA: Insufficient documentation

## 2014-12-07 DIAGNOSIS — Z13220 Encounter for screening for lipoid disorders: Secondary | ICD-10-CM

## 2014-12-07 DIAGNOSIS — Z01419 Encounter for gynecological examination (general) (routine) without abnormal findings: Secondary | ICD-10-CM

## 2014-12-07 NOTE — Progress Notes (Signed)
Tara Duffy [birth year] [id number]    History:    Presents for annual exam.  Amenorrheic 1 year with occasional hot flushes but manageable. 2013 ablation. Normal Pap and mammogram history. New partner. History of anemia and iron infusions from menorrhagia. Has not had a colonoscopy. Has had some intermittent chest discomfort, no shortness of breath, no nausea, none with exercise, no loss of consciousness or dizziness, strong family history of heart disease.  Past medical history, past surgical history, family history and social history were all reviewed and documented in the EPIC chart. 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 5 children, 3 grandchildren daughters all doing well.  ROS:  A ROS was performed and pertinent positives and negatives are included.  Exam:  Filed Vitals:   12/07/14 0938  BP: 120/74    General appearance:  Normal Thyroid:  Symmetrical, normal in size, without palpable masses or nodularity. Respiratory  Auscultation:  Clear without wheezing or rhonchi Cardiovascular  Auscultation:  Regular rate, without rubs, murmurs or gallops  Edema/varicosities:  Not grossly evident Abdominal  Soft,nontender, without masses, guarding or rebound.  Liver/spleen:  No organomegaly noted  Hernia:  None appreciated  Skin  Inspection:  Grossly normal   Breasts: Examined lying and sitting.     Right: Without masses, retractions, discharge or axillary adenopathy.     Left: Without masses, retractions, discharge or axillary adenopathy. Gentitourinary   Inguinal/mons:  Normal without inguinal adenopathy  External genitalia:  Normal  BUS/Urethra/Skene's glands:  Normal  Vagina:  Normal  Cervix:  Normal  Uterus:   normal in size, shape and contour.  Midline and mobile  Adnexa/parametria:     Rt: Without masses or tenderness.   Lt: Without masses or tenderness.  Anus and perineum: Normal  Digital rectal exam: Normal sphincter tone without palpated  masses or tenderness  Assessment/Plan:  54 y.o.[name] G4P5  for annual exam.     Intermittent chest pain/strong family history of heart disease Postmenopausal/no HRT/no bleeding STD screen  Plan: Instructed to schedule appointment with  [facility] cardiology. Also discussed 911 or ER if pain returns. SBE's, continue annual screening mammogram, calcium rich diet, vitamin D 2000 daily encouraged. Reviewed importance of daily regular exercise, home safety and fall prevention. CBC, CMP, lipid panel, TSH, UA, Pap with HR HPV typing. GC/Chlamydia, HIV, hep B, C, RPR. Reviewed importance of screening colonoscopy instructed to schedule.   Huel Cote WHNP, 1:12 PM 12/07/2014

## 2014-12-07 NOTE — Patient Instructions (Signed)

## 2014-12-08 ENCOUNTER — Other Ambulatory Visit: Payer: BLUE CROSS/BLUE SHIELD

## 2014-12-08 LAB — LIPID PANEL
CHOLESTEROL: 183 mg/dL (ref 0–200)
HDL: 44 mg/dL (ref >39)
LDL Cholesterol: 126 mg/dL — ABNORMAL HIGH (ref 0–99)
Total CHOL/HDL Ratio: 4.2 Ratio
Triglycerides: 67 mg/dL (ref <150)
VLDL: 13 mg/dL (ref 0–40)

## 2014-12-08 LAB — CBC WITH DIFFERENTIAL/PLATELET
BASOS PCT: 1 % (ref 0–1)
Basophils Absolute: 0.1 10*3/uL (ref 0.0–0.1)
Eosinophils Absolute: 0.4 10*3/uL (ref 0.0–0.7)
Eosinophils Relative: 8 % — ABNORMAL HIGH (ref 0–5)
HCT: 40.8 % (ref 36.0–46.0)
Hemoglobin: 13.5 g/dL (ref 12.0–15.0)
LYMPHS PCT: 34 % (ref 12–46)
Lymphs Abs: 1.9 10*3/uL (ref 0.7–4.0)
MCH: 29 pg (ref 26.0–34.0)
MCHC: 33.1 g/dL (ref 30.0–36.0)
MCV: 87.7 fL (ref 78.0–100.0)
MONO ABS: 0.4 10*3/uL (ref 0.1–1.0)
MPV: 11 fL (ref 8.6–12.4)
Monocytes Relative: 7 % (ref 3–12)
NEUTROS ABS: 2.8 10*3/uL (ref 1.7–7.7)
NEUTROS PCT: 50 % (ref 43–77)
PLATELETS: 231 10*3/uL (ref 150–400)
RBC: 4.65 MIL/uL (ref 3.87–5.11)
RDW: 14.2 % (ref 11.5–15.5)
WBC: 5.5 10*3/uL (ref 4.0–10.5)

## 2014-12-08 LAB — COMPREHENSIVE METABOLIC PANEL
ALT: 11 U/L (ref 0–35)
AST: 15 U/L (ref 0–37)
Albumin: 3.3 g/dL — ABNORMAL LOW (ref 3.5–5.2)
Alkaline Phosphatase: 56 U/L (ref 39–117)
BUN: 13 mg/dL (ref 6–23)
CO2: 27 mEq/L (ref 19–32)
CREATININE: 0.7 mg/dL (ref 0.50–1.10)
Calcium: 8.7 mg/dL (ref 8.4–10.5)
Chloride: 107 mEq/L (ref 96–112)
GLUCOSE: 72 mg/dL (ref 70–99)
POTASSIUM: 4.3 meq/L (ref 3.5–5.3)
Sodium: 141 mEq/L (ref 135–145)
Total Bilirubin: 0.5 mg/dL (ref 0.2–1.2)
Total Protein: 5.5 g/dL — ABNORMAL LOW (ref 6.0–8.3)

## 2014-12-08 LAB — GC/CHLAMYDIA PROBE AMP
CT Probe RNA: NEGATIVE
GC Probe RNA: NEGATIVE

## 2014-12-09 LAB — HEPATITIS B SURFACE ANTIGEN: Hepatitis B Surface Ag: NEGATIVE

## 2014-12-09 LAB — VITAMIN D 25 HYDROXY (VIT D DEFICIENCY, FRACTURES): Vit D, 25-Hydroxy: 27 ng/mL — ABNORMAL LOW (ref 30–100)

## 2014-12-09 LAB — RPR

## 2014-12-09 LAB — TSH: TSH: 1.105 u[IU]/mL (ref 0.350–4.500)

## 2014-12-09 LAB — HIV ANTIBODY (ROUTINE TESTING W REFLEX): HIV 1&2 Ab, 4th Generation: NONREACTIVE

## 2014-12-09 LAB — HEPATITIS C ANTIBODY: HCV Ab: NEGATIVE

## 2014-12-09 LAB — CYTOLOGY - PAP

## 2014-12-31 ENCOUNTER — Other Ambulatory Visit: Payer: BLUE CROSS/BLUE SHIELD

## 2014-12-31 ENCOUNTER — Ambulatory Visit
Admission: RE | Admit: 2014-12-31 | Discharge: 2014-12-31 | Disposition: A | Discharge status: Home / Self Care | Payer: No Typology Code available for payment source | Source: Ambulatory Visit | Attending: Infectious Disease | Admitting: Infectious Disease

## 2014-12-31 ENCOUNTER — Other Ambulatory Visit: Payer: Self-pay | Admitting: Infectious Disease

## 2014-12-31 DIAGNOSIS — R7611 Nonspecific reaction to tuberculin skin test without active tuberculosis: Secondary | ICD-10-CM

## 2015-01-13 ENCOUNTER — Telehealth: Payer: Self-pay | Admitting: *Deleted

## 2015-01-13 DIAGNOSIS — F419 Anxiety disorder, unspecified: Secondary | ICD-10-CM

## 2015-01-13 MED ORDER — SERTRALINE HCL 100 MG PO TABS: 100.0000 mg | ORAL_TABLET | Freq: Every day | ORAL | Status: DC

## 2015-01-13 NOTE — Telephone Encounter (Signed)
Pharmacy faxed refill for 90 day supply for Zoloft 100 mg on po daily. Pt was seen for annual in Feb. Please advise

## 2015-01-13 NOTE — Telephone Encounter (Signed)
rx sent

## 2015-01-13 NOTE — Telephone Encounter (Signed)
Ok to fill 90 day supply with 4 refills.

## 2015-09-14 ENCOUNTER — Telehealth: Payer: Self-pay | Admitting: *Deleted

## 2015-09-14 ENCOUNTER — Encounter: Payer: Self-pay | Admitting: Women's Health

## 2015-09-14 NOTE — Telephone Encounter (Signed)
Telephone call, states is working in [place], has had a cough using Delsym, no fever, no facial or jaw pain. Nasal congestion white, using some Robitussin. Reviewed importance to treat the symptoms Robitussin, continue Delsym for cough, increase rest. Reviewed best not to use an antibiotic at this point. If symptoms persist greater than 2 weeks call back.

## 2015-09-14 NOTE — Telephone Encounter (Signed)
Pt called c/o bronchitis infection in [place] now for work asked if you could send a antibiotic in for her?  Pt said you may call her if needed [phone], has No PCP. Please advise

## 2016-02-19 IMAGING — CR DG CHEST 1V
1 series · 1 of 1 positions shown · non-contrast
Comparison: None.

CLINICAL DATA: Positive PPD, asymptomatic patient.

EXAM:
CHEST  1 VIEW

[w chest pa]
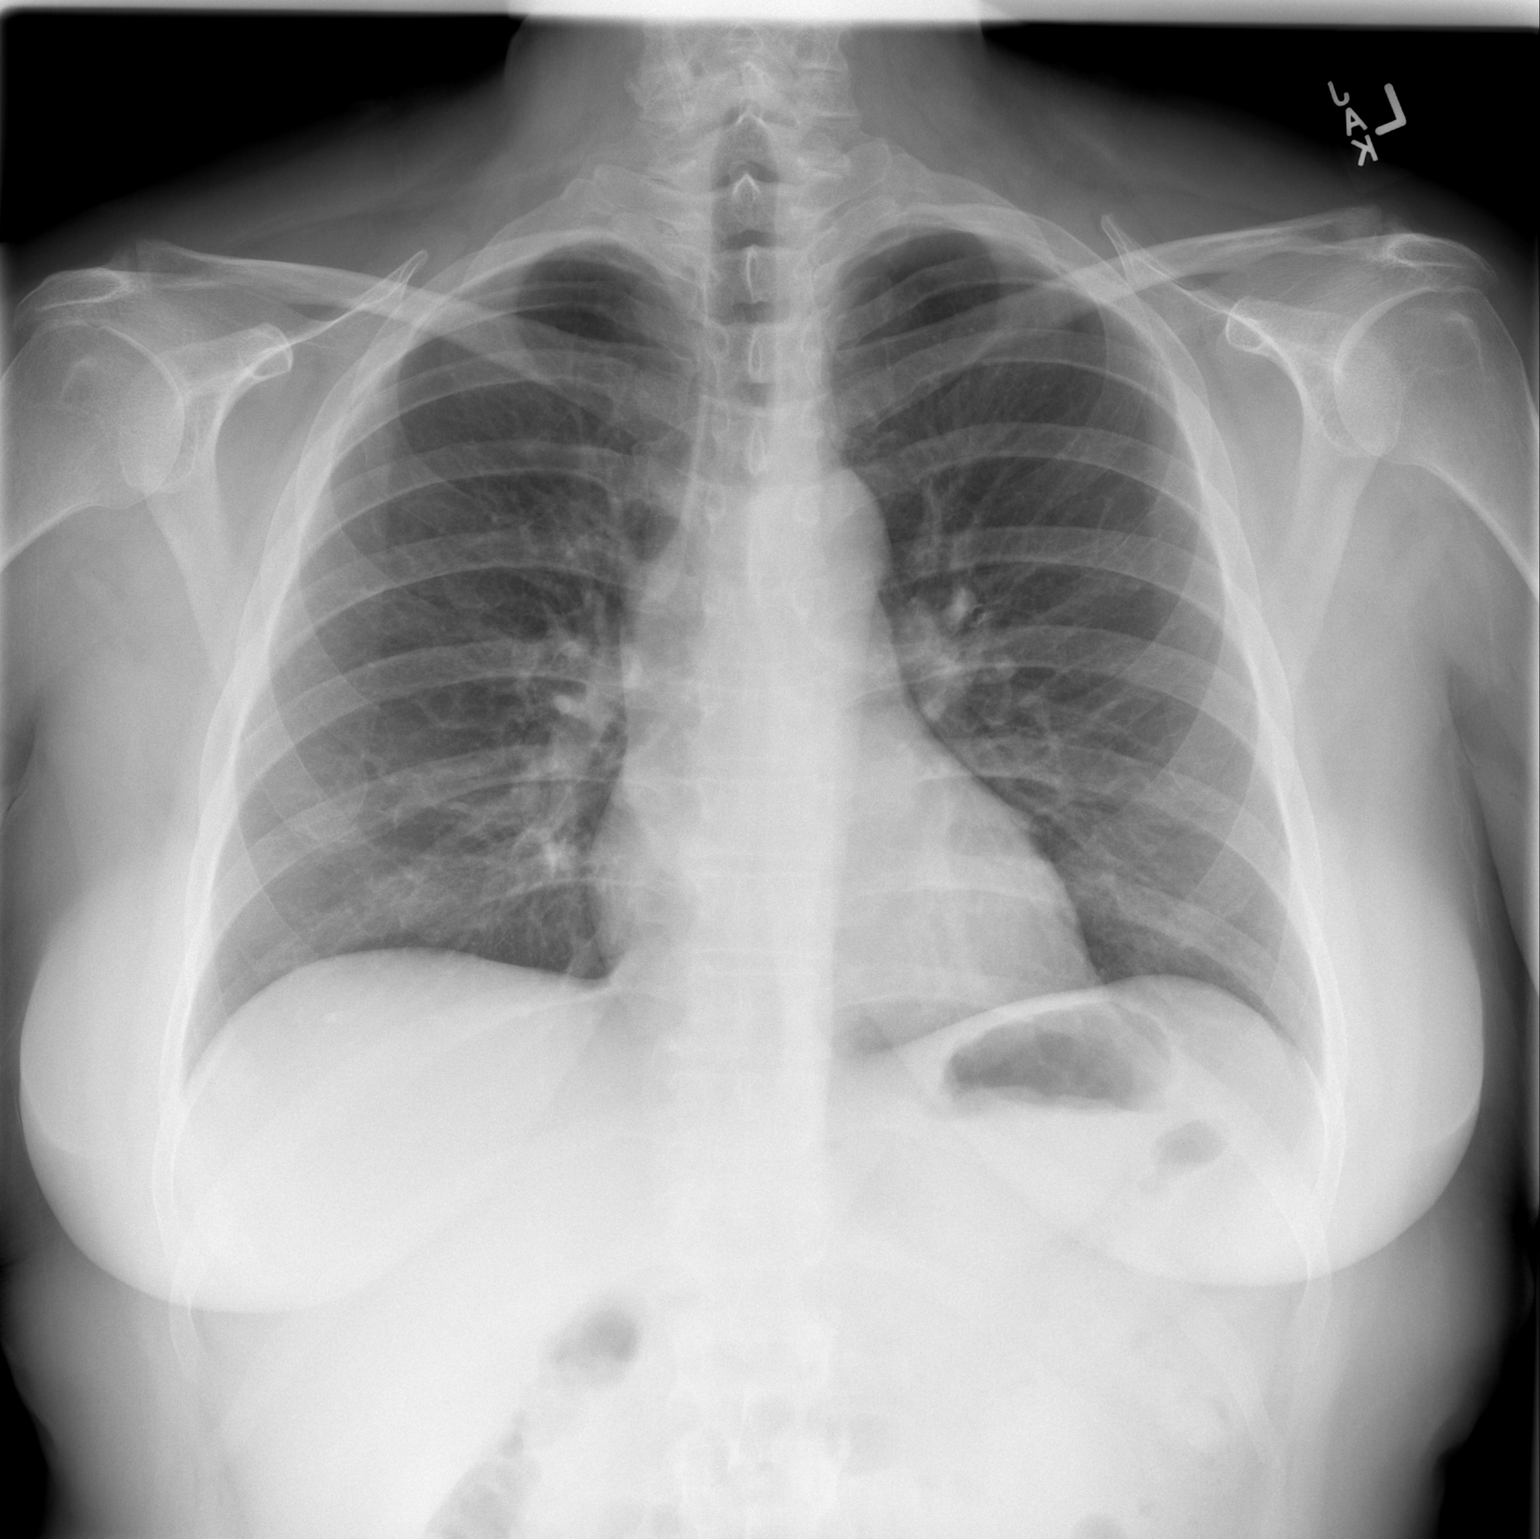

[1 of 1 positions shown; findings below may reference images not displayed]

FINDINGS: The lungs are well-expanded and clear. The heart and pulmonary
vascularity are normal. The mediastinum is normal in width. The
trachea is midline. The bony thorax is unremarkable.
IMPRESSION: There is no evidence of acute or old tuberculous infection. There is
no active cardiopulmonary disease.

## 2016-02-23 ENCOUNTER — Other Ambulatory Visit: Payer: Self-pay

## 2016-02-23 DIAGNOSIS — Z1231 Encounter for screening mammogram for malignant neoplasm of breast: Secondary | ICD-10-CM

## 2016-03-09 ENCOUNTER — Ambulatory Visit
Admission: RE | Admit: 2016-03-09 | Discharge: 2016-03-09 | Disposition: A | Discharge status: Home / Self Care | Payer: BLUE CROSS/BLUE SHIELD | Source: Ambulatory Visit

## 2016-03-09 DIAGNOSIS — Z1231 Encounter for screening mammogram for malignant neoplasm of breast: Secondary | ICD-10-CM

## 2016-03-12 ENCOUNTER — Other Ambulatory Visit: Payer: Self-pay

## 2016-03-12 DIAGNOSIS — F419 Anxiety disorder, unspecified: Secondary | ICD-10-CM

## 2016-03-12 MED ORDER — SERTRALINE HCL 100 MG PO TABS: 100.0000 mg | ORAL_TABLET | Freq: Every day | ORAL | Status: DC

## 2016-03-12 NOTE — Telephone Encounter (Signed)
Patient's last CE 12/07/14.  She is scheduled 04/13/16 with [place] for [place].

## 2016-03-13 ENCOUNTER — Other Ambulatory Visit: Payer: Self-pay

## 2016-03-13 DIAGNOSIS — F419 Anxiety disorder, unspecified: Secondary | ICD-10-CM

## 2016-03-13 MED ORDER — SERTRALINE HCL 100 MG PO TABS: 100.0000 mg | ORAL_TABLET | Freq: Every day | ORAL | Status: DC

## 2016-04-04 ENCOUNTER — Telehealth: Payer: Self-pay | Admitting: *Deleted

## 2016-04-04 NOTE — Telephone Encounter (Signed)
Okay order signed.

## 2016-04-04 NOTE — Telephone Encounter (Signed)
Pt called stating her job is requesting a physician order to have a substance abuse drug screen, pt asked if you could sign the order for her? [facility] is requesting this order. I will place order on your desk to sign. Please advise

## 2016-04-04 NOTE — Telephone Encounter (Signed)
I called pt and left on her voicemail she can pick up order

## 2016-04-05 ENCOUNTER — Other Ambulatory Visit: Payer: Self-pay | Admitting: Women's Health

## 2016-04-08 LAB — DRUG ABUSE PANEL 10-50 NO CONF, U
AMPHETAMINES (1000 NG/ML SCRN): NEGATIVE
BARBITURATES: NEGATIVE
BENZODIAZEPINES: NEGATIVE
COCAINE METABOLITES: NEGATIVE
MARIJUANA MET (50 NG/ML SCRN): NEGATIVE
METHADONE: NEGATIVE
METHAQUALONE: NEGATIVE
OPIATES: NEGATIVE
PHENCYCLIDINE: NEGATIVE
PROPOXYPHENE: NEGATIVE

## 2016-04-13 ENCOUNTER — Encounter: Payer: Self-pay | Admitting: Women's Health

## 2016-04-13 ENCOUNTER — Ambulatory Visit (INDEPENDENT_AMBULATORY_CARE_PROVIDER_SITE_OTHER): Payer: BLUE CROSS/BLUE SHIELD | Admitting: Women's Health

## 2016-04-13 VITALS — BP 122/80 | Ht 67.0 in | Wt 216.0 lb

## 2016-04-13 DIAGNOSIS — Z13220 Encounter for screening for lipoid disorders: Secondary | ICD-10-CM | POA: Diagnosis not present

## 2016-04-13 DIAGNOSIS — F419 Anxiety disorder, unspecified: Secondary | ICD-10-CM

## 2016-04-13 DIAGNOSIS — Z01419 Encounter for gynecological examination (general) (routine) without abnormal findings: Secondary | ICD-10-CM

## 2016-04-13 DIAGNOSIS — Z1329 Encounter for screening for other suspected endocrine disorder: Secondary | ICD-10-CM | POA: Diagnosis not present

## 2016-04-13 DIAGNOSIS — M25572 Pain in left ankle and joints of left foot: Secondary | ICD-10-CM | POA: Diagnosis not present

## 2016-04-13 LAB — CBC WITH DIFFERENTIAL/PLATELET
Basophils Absolute: 48 cells/uL (ref 0–200)
Basophils Relative: 1 %
Eosinophils Absolute: 144 cells/uL (ref 15–500)
Eosinophils Relative: 3 %
HEMATOCRIT: 40.8 % (ref 35.0–45.0)
Hemoglobin: 13.6 g/dL (ref 11.7–15.5)
LYMPHS PCT: 37 %
Lymphs Abs: 1776 cells/uL (ref 850–3900)
MCH: 28.9 pg (ref 27.0–33.0)
MCHC: 33.3 g/dL (ref 32.0–36.0)
MCV: 86.6 fL (ref 80.0–100.0)
MONO ABS: 432 {cells}/uL (ref 200–950)
MONOS PCT: 9 %
MPV: 10.8 fL (ref 7.5–12.5)
NEUTROS PCT: 50 %
Neutro Abs: 2400 cells/uL (ref 1500–7800)
Platelets: 249 10*3/uL (ref 140–400)
RBC: 4.71 MIL/uL (ref 3.80–5.10)
RDW: 14.3 % (ref 11.0–15.0)
WBC: 4.8 10*3/uL (ref 3.8–10.8)

## 2016-04-13 LAB — COMPREHENSIVE METABOLIC PANEL
ALT: 11 U/L (ref 6–29)
AST: 14 U/L (ref 10–35)
Albumin: 4 g/dL (ref 3.6–5.1)
Alkaline Phosphatase: 68 U/L (ref 33–130)
BUN: 12 mg/dL (ref 7–25)
CALCIUM: 8.8 mg/dL (ref 8.6–10.4)
CO2: 25 mmol/L (ref 20–31)
Chloride: 104 mmol/L (ref 98–110)
Creat: 0.76 mg/dL (ref 0.50–1.05)
GLUCOSE: 103 mg/dL — AB (ref 65–99)
POTASSIUM: 3.9 mmol/L (ref 3.5–5.3)
Sodium: 139 mmol/L (ref 135–146)
Total Bilirubin: 0.4 mg/dL (ref 0.2–1.2)
Total Protein: 6.6 g/dL (ref 6.1–8.1)

## 2016-04-13 LAB — LIPID PANEL
CHOL/HDL RATIO: 3.7 ratio (ref <=5.0)
Cholesterol: 165 mg/dL (ref 125–200)
HDL: 45 mg/dL — AB (ref >=46)
LDL CALC: 103 mg/dL (ref <130)
Triglycerides: 86 mg/dL (ref <150)
VLDL: 17 mg/dL (ref <30)

## 2016-04-13 LAB — TSH: TSH: 1.1 mIU/L

## 2016-04-13 LAB — URIC ACID: Uric Acid, Serum: 3.1 mg/dL (ref 2.5–7.0)

## 2016-04-13 MED ORDER — SERTRALINE HCL 100 MG PO TABS: 100.0000 mg | ORAL_TABLET | Freq: Every day | ORAL | Status: DC

## 2016-04-13 NOTE — Progress Notes (Signed)
Tara Duffy [birth year] [id number]    History:    Presents for annual exam.  Postmenopausal with no bleeding greater than 2 years. 2013 history of ablation. Normal Pap and mammogram history. Negative STD screen 11/2014/ same partner. Has not had a screening colonoscopy.   Past medical history, past surgical history, family history and social history were all reviewed and documented in the EPIC chart.Works in cardiac cath lab. Has 5 children all doing well.  ROS:  A ROS was performed and pertinent positives and negatives are included.  Exam:  Filed Vitals:   04/13/16 1003  BP: 122/80    General appearance:  Normal Thyroid:  Symmetrical, normal in size, without palpable masses or nodularity. Respiratory  Auscultation:  Clear without wheezing or rhonchi Cardiovascular  Auscultation:  Regular rate, without rubs, murmurs or gallops  Edema/varicosities:  Not grossly evident Abdominal  Soft,nontender, without masses, guarding or rebound.  Liver/spleen:  No organomegaly noted  Hernia:  None appreciated  Skin  Inspection:  Grossly normal   Breasts: Examined lying and sitting.     Right: Without masses, retractions, discharge or axillary adenopathy.     Left: Without masses, retractions, discharge or axillary adenopathy. Gentitourinary   Inguinal/mons:  Normal without inguinal adenopathy  External genitalia:  Normal  BUS/Urethra/Skene's glands:  Normal  Vagina:  Normal  Cervix:  Normal  Uterus:  normal in size, shape and contour.  Midline and mobile  Adnexa/parametria:     Rt: Without masses or tenderness.   Lt: Without masses or tenderness.  Anus and perineum: Normal  Digital rectal exam: Normal sphincter tone without palpated masses or tenderness  Assessment/Plan:  55 y.o. D WF G5 P5  for annual exam with several concerns, bump outer aspect of left eye, questionable bladder spasms and left great toe pain.  Postmenopausal/no HRT/no bleeding Obesity Left great toe  pain Questionable bladder spasm Depression stable on  Zoloft  Plan: Instructed to schedule appointment with ophthalmologist for questionable bump on outer aspect of left eye . Continue healthy diet, increasing exercise, has lost 8 pounds in the past year. SBE's, continue annual screening mammogram, calcium rich diet, vitamin D 2000 daily encouraged. Currently on Zoloft 100 mg daily, states life is in good order with decreased stress would like to decrease, will start half tablet and continue call if problems. Reviewed importance of leisure and exercise. Has had counseling in the past. CBC, uric acid, lipid panel, TSH, CMP, UA, Pap normal with negative HR HPV 2016, new screening guidelines reviewed. Reviewed if UA is negative and continued questionable bladder spasms refer to urologist. Instructed to schedule screening colonoscopy [facility] information given and reviewed.    Huel Cote [facility], 11:35 AM 04/13/2016

## 2016-04-13 NOTE — Patient Instructions (Signed)
Dr Carlean Purl   Colonoscopy  [phone]  Menopause is a normal process in which your reproductive ability comes to an end. This process happens gradually over a span of months to years, usually between the ages of 62 and 71. Menopause is complete when you have missed 12 consecutive menstrual periods. It is important to talk with your health care provider about some of the most common conditions that affect postmenopausal women, such as heart disease, cancer, and bone loss (osteoporosis). Adopting a healthy lifestyle and getting preventive care can help to promote your health and wellness. Those actions can also lower your chances of developing some of these common conditions. WHAT SHOULD I KNOW ABOUT MENOPAUSE? During menopause, you may experience a number of symptoms, such as:  Moderate-to-severe hot flashes.  Night sweats.  Decrease in sex drive.  Mood swings.  Headaches.  Tiredness.  Irritability.  Memory problems.  Insomnia. Choosing to treat or not to treat menopausal changes is an individual decision that you make with your health care provider. WHAT SHOULD I KNOW ABOUT HORMONE REPLACEMENT THERAPY AND SUPPLEMENTS? Hormone therapy products are effective for treating symptoms that are associated with menopause, such as hot flashes and night sweats. Hormone replacement carries certain risks, especially as you become older. If you are thinking about using estrogen or estrogen with progestin treatments, discuss the benefits and risks with your health care provider. WHAT SHOULD I KNOW ABOUT HEART DISEASE AND STROKE? Heart disease, heart attack, and stroke become more likely as you age. This may be due, in part, to the hormonal changes that your body experiences during menopause. These can affect how your body processes dietary fats, triglycerides, and cholesterol. Heart attack and stroke are both medical emergencies. There are many things that you can do to help prevent heart disease and  stroke:  Have your blood pressure checked at least every 1-2 years. High blood pressure causes heart disease and increases the risk of stroke.  If you are 35-87 years old, ask your health care provider if you should take aspirin to prevent a heart attack or a stroke.  Do not use any tobacco products, including cigarettes, chewing tobacco, or electronic cigarettes. If you need help quitting, ask your health care provider.  It is important to eat a healthy diet and maintain a healthy weight.  Be sure to include plenty of vegetables, fruits, low-fat dairy products, and lean protein.  Avoid eating foods that are high in solid fats, added sugars, or salt (sodium).  Get regular exercise. This is one of the most important things that you can do for your health.  Try to exercise for at least 150 minutes each week. The type of exercise that you do should increase your heart rate and make you sweat. This is known as moderate-intensity exercise.  Try to do strengthening exercises at least twice each week. Do these in addition to the moderate-intensity exercise.  Know your numbers.Ask your health care provider to check your cholesterol and your blood glucose. Continue to have your blood tested as directed by your health care provider. WHAT SHOULD I KNOW ABOUT CANCER SCREENING? There are several types of cancer. Take the following steps to reduce your risk and to catch any cancer development as early as possible. Breast Cancer  Practice breast self-awareness.  This means understanding how your breasts normally appear and feel.  It also means doing regular breast self-exams. Let your health care provider know about any changes, no matter how small.  If you are  40 or older, have a clinician do a breast exam (clinical breast exam or CBE) every year. Depending on your age, family history, and medical history, it may be recommended that you also have a yearly breast X-ray (mammogram).  If you  have a family history of breast cancer, talk with your health care provider about genetic screening.  If you are at high risk for breast cancer, talk with your health care provider about having an MRI and a mammogram every year.  Breast cancer (BRCA) gene test is recommended for women who have family members with BRCA-related cancers. Results of the assessment will determine the need for genetic counseling and BRCA1 and for BRCA2 testing. BRCA-related cancers include these types:  Breast. This occurs in males or females.  Ovarian.  Tubal. This may also be called fallopian tube cancer.  Cancer of the abdominal or pelvic lining (peritoneal cancer).  Prostate.  Pancreatic. Cervical, Uterine, and Ovarian Cancer Your health care provider may recommend that you be screened regularly for cancer of the pelvic organs. These include your ovaries, uterus, and vagina. This screening involves a pelvic exam, which includes checking for microscopic changes to the surface of your cervix (Pap test).  For women ages 21-65, health care providers may recommend a pelvic exam and a Pap test every three years. For women ages 30-65, they may recommend the Pap test and pelvic exam, combined with testing for human papilloma virus (HPV), every five years. Some types of HPV increase your risk of cervical cancer. Testing for HPV may also be done on women of any age who have unclear Pap test results.  Other health care providers may not recommend any screening for nonpregnant women who are considered low risk for pelvic cancer and have no symptoms. Ask your health care provider if a screening pelvic exam is right for you.  If you have had past treatment for cervical cancer or a condition that could lead to cancer, you need Pap tests and screening for cancer for at least 20 years after your treatment. If Pap tests have been discontinued for you, your risk factors (such as having a new sexual partner) need to be reassessed  to determine if you should start having screenings again. Some women have medical problems that increase the chance of getting cervical cancer. In these cases, your health care provider may recommend that you have screening and Pap tests more often.  If you have a family history of uterine cancer or ovarian cancer, talk with your health care provider about genetic screening.  If you have vaginal bleeding after reaching menopause, tell your health care provider.  There are currently no reliable tests available to screen for ovarian cancer. Lung Cancer Lung cancer screening is recommended for adults 55-80 years old who are at high risk for lung cancer because of a history of smoking. A yearly low-dose CT scan of the lungs is recommended if you:  Currently smoke.  Have a history of at least 30 pack-years of smoking and you currently smoke or have quit within the past 15 years. A pack-year is smoking an average of one pack of cigarettes per day for one year. Yearly screening should:  Continue until it has been 15 years since you quit.  Stop if you develop a health problem that would prevent you from having lung cancer treatment. Colorectal Cancer  This type of cancer can be detected and can often be prevented.  Routine colorectal cancer screening usually begins at age 50 and   continues through age 75.  If you have risk factors for colon cancer, your health care provider may recommend that you be screened at an earlier age.  If you have a family history of colorectal cancer, talk with your health care provider about genetic screening.  Your health care provider may also recommend using home test kits to check for hidden blood in your stool.  A small camera at the end of a tube can be used to examine your colon directly (sigmoidoscopy or colonoscopy). This is done to check for the earliest forms of colorectal cancer.  Direct examination of the colon should be repeated every 5-10 years until  age 75. However, if early forms of precancerous polyps or small growths are found or if you have a family history or genetic risk for colorectal cancer, you may need to be screened more often. Skin Cancer  Check your skin from head to toe regularly.  Monitor any moles. Be sure to tell your health care provider:  About any new moles or changes in moles, especially if there is a change in a mole's shape or color.  If you have a mole that is larger than the size of a pencil eraser.  If any of your family members has a history of skin cancer, especially at a Gary Gabrielsen age, talk with your health care provider about genetic screening.  Always use sunscreen. Apply sunscreen liberally and repeatedly throughout the day.  Whenever you are outside, protect yourself by wearing long sleeves, pants, a wide-brimmed hat, and sunglasses. WHAT SHOULD I KNOW ABOUT OSTEOPOROSIS? Osteoporosis is a condition in which bone destruction happens more quickly than new bone creation. After menopause, you may be at an increased risk for osteoporosis. To help prevent osteoporosis or the bone fractures that can happen because of osteoporosis, the following is recommended:  If you are 19-50 years old, get at least 1,000 mg of calcium and at least 600 mg of vitamin D per day.  If you are older than age 50 but younger than age 70, get at least 1,200 mg of calcium and at least 600 mg of vitamin D per day.  If you are older than age 70, get at least 1,200 mg of calcium and at least 800 mg of vitamin D per day. Smoking and excessive alcohol intake increase the risk of osteoporosis. Eat foods that are rich in calcium and vitamin D, and do weight-bearing exercises several times each week as directed by your health care provider. WHAT SHOULD I KNOW ABOUT HOW MENOPAUSE AFFECTS MY MENTAL HEALTH? Depression may occur at any age, but it is more common as you become older. Common symptoms of depression include:  Low or sad  mood.  Changes in sleep patterns.  Changes in appetite or eating patterns.  Feeling an overall lack of motivation or enjoyment of activities that you previously enjoyed.  Frequent crying spells. Talk with your health care provider if you think that you are experiencing depression. WHAT SHOULD I KNOW ABOUT IMMUNIZATIONS? It is important that you get and maintain your immunizations. These include:  Tetanus, diphtheria, and pertussis (Tdap) booster vaccine.  Influenza every year before the flu season begins.  Pneumonia vaccine.  Shingles vaccine. Your health care provider may also recommend other immunizations.   This information is not intended to replace advice given to you by your health care provider. Make sure you discuss any questions you have with your health care provider.   Document Released: 12/07/2005 Document Revised: 11/05/2014 Document   Reviewed: 06/17/2014 [employer] Interactive Patient Education [employer].

## 2016-04-14 LAB — URINALYSIS W MICROSCOPIC + REFLEX CULTURE
Bacteria, UA: NONE SEEN [HPF]
Bilirubin Urine: NEGATIVE
CASTS: NONE SEEN [LPF]
Crystals: NONE SEEN [HPF]
Glucose, UA: NEGATIVE
HGB URINE DIPSTICK: NEGATIVE
KETONES UR: NEGATIVE
Leukocytes, UA: NEGATIVE
NITRITE: NEGATIVE
PH: 7 (ref 5.0–8.0)
PROTEIN: NEGATIVE
SPECIFIC GRAVITY, URINE: 1.007 (ref 1.001–1.035)
WBC, UA: NONE SEEN WBC/HPF (ref <=5)
YEAST: NONE SEEN [HPF]

## 2016-04-14 LAB — VITAMIN D 25 HYDROXY (VIT D DEFICIENCY, FRACTURES): VIT D 25 HYDROXY: 30 ng/mL (ref 30–100)

## 2016-04-16 LAB — URINE CULTURE: Colony Count: 70000

## 2016-07-28 ENCOUNTER — Other Ambulatory Visit: Payer: Self-pay | Admitting: Women's Health

## 2016-07-28 DIAGNOSIS — F419 Anxiety disorder, unspecified: Secondary | ICD-10-CM

## 2017-01-22 ENCOUNTER — Other Ambulatory Visit: Payer: Self-pay | Admitting: Women's Health

## 2017-01-22 DIAGNOSIS — F419 Anxiety disorder, unspecified: Secondary | ICD-10-CM

## 2017-02-06 ENCOUNTER — Other Ambulatory Visit: Payer: Self-pay | Admitting: Women's Health

## 2017-02-06 ENCOUNTER — Other Ambulatory Visit: Payer: Self-pay | Admitting: Gynecology

## 2017-02-06 DIAGNOSIS — Z1231 Encounter for screening mammogram for malignant neoplasm of breast: Secondary | ICD-10-CM

## 2017-03-13 ENCOUNTER — Encounter: Payer: Self-pay | Admitting: Gynecology

## 2017-04-29 ENCOUNTER — Encounter: Payer: Self-pay | Admitting: Women's Health

## 2017-04-29 ENCOUNTER — Ambulatory Visit (INDEPENDENT_AMBULATORY_CARE_PROVIDER_SITE_OTHER): Payer: BLUE CROSS/BLUE SHIELD | Admitting: Women's Health

## 2017-04-29 ENCOUNTER — Ambulatory Visit
Admission: RE | Admit: 2017-04-29 | Discharge: 2017-04-29 | Disposition: A | Discharge status: Home / Self Care | Payer: BLUE CROSS/BLUE SHIELD | Source: Ambulatory Visit | Attending: Women's Health | Admitting: Women's Health

## 2017-04-29 VITALS — BP 124/80 | Ht 68.0 in | Wt 235.0 lb

## 2017-04-29 DIAGNOSIS — Z113 Encounter for screening for infections with a predominantly sexual mode of transmission: Secondary | ICD-10-CM | POA: Diagnosis not present

## 2017-04-29 DIAGNOSIS — Z1151 Encounter for screening for human papillomavirus (HPV): Secondary | ICD-10-CM | POA: Diagnosis not present

## 2017-04-29 DIAGNOSIS — Z01419 Encounter for gynecological examination (general) (routine) without abnormal findings: Secondary | ICD-10-CM | POA: Diagnosis not present

## 2017-04-29 DIAGNOSIS — F419 Anxiety disorder, unspecified: Secondary | ICD-10-CM

## 2017-04-29 DIAGNOSIS — Z1231 Encounter for screening mammogram for malignant neoplasm of breast: Secondary | ICD-10-CM

## 2017-04-29 MED ORDER — SERTRALINE HCL 100 MG PO TABS: 100.0000 mg | ORAL_TABLET | Freq: Every day | ORAL | 4 refills | Status: DC

## 2017-04-29 NOTE — Patient Instructions (Addendum)
[facility]  [id number]   Carbohydrate Counting for Diabetes Mellitus, Adult Carbohydrate counting is a method for keeping track of how many carbohydrates you eat. Eating carbohydrates naturally increases the amount of sugar (glucose) in the blood. Counting how many carbohydrates you eat helps keep your blood glucose within normal limits, which helps you manage your diabetes (diabetes mellitus). It is important to know how many carbohydrates you can safely have in each meal. This is different for every person. A diet and nutrition specialist (registered dietitian) can help you make a meal plan and calculate how many carbohydrates you should have at each meal and snack. Carbohydrates are found in the following foods:  Grains, such as breads and cereals.  Dried beans and soy products.  Starchy vegetables, such as potatoes, peas, and corn.  Fruit and fruit juices.  Milk and yogurt.  Sweets and snack foods, such as cake, cookies, candy, chips, and soft drinks.  How do I count carbohydrates? There are two ways to count carbohydrates in food. You can use either of the methods or a combination of both. Reading "Nutrition Facts" on packaged food The "Nutrition Facts" list is included on the labels of almost all packaged foods and beverages in the [place] It includes:  The serving size.  Information about nutrients in each serving, including the grams (g) of carbohydrate per serving.  To use the "Nutrition Facts":  Decide how many servings you will have.  Multiply the number of servings by the number of carbohydrates per serving.  The resulting number is the total amount of carbohydrates that you will be having.  Learning standard serving sizes of other foods When you eat foods containing carbohydrates that are not packaged or do not include "Nutrition Facts" on the label, you need to measure the servings in order to count the amount of carbohydrates:  Measure the foods that you will eat  with a food scale or measuring cup, if needed.  Decide how many standard-size servings you will eat.  Multiply the number of servings by 15. Most carbohydrate-rich foods have about 15 g of carbohydrates per serving. ? For example, if you eat 8 oz (170 g) of strawberries, you will have eaten 2 servings and 30 g of carbohydrates (2 servings x 15 g = 30 g).  For foods that have more than one food mixed, such as soups and casseroles, you must count the carbohydrates in each food that is included.  The following list contains standard serving sizes of common carbohydrate-rich foods. Each of these servings has about 15 g of carbohydrates:   hamburger bun or  English muffin.   oz (15 mL) syrup.   oz (14 g) jelly.  1 slice of bread.  1 six-inch tortilla.  3 oz (85 g) cooked rice or pasta.  4 oz (113 g) cooked dried beans.  4 oz (113 g) starchy vegetable, such as peas, corn, or potatoes.  4 oz (113 g) hot cereal.  4 oz (113 g) mashed potatoes or  of a large baked potato.  4 oz (113 g) canned or frozen fruit.  4 oz (120 mL) fruit juice.  4-6 crackers.  6 chicken nuggets.  6 oz (170 g) unsweetened dry cereal.  6 oz (170 g) plain fat-free yogurt or yogurt sweetened with artificial sweeteners.  8 oz (240 mL) milk.  8 oz (170 g) fresh fruit or one small piece of fruit.  24 oz (680 g) popped popcorn.  Example of carbohydrate counting Sample meal  3 oz (85 g) chicken breast.  6 oz (170 g) brown rice.  4 oz (113 g) corn.  8 oz (240 mL) milk.  8 oz (170 g) strawberries with sugar-free whipped topping. Carbohydrate calculation 1. Identify the foods that contain carbohydrates: ? Rice. ? Corn. ? Milk. ? Strawberries. 2. Calculate how many servings you have of each food: ? 2 servings rice. ? 1 serving corn. ? 1 serving milk. ? 1 serving strawberries. 3. Multiply each number of servings by 15 g: ? 2 servings rice x 15 g = 30 g. ? 1 serving corn x 15 g = 15  g. ? 1 serving milk x 15 g = 15 g. ? 1 serving strawberries x 15 g = 15 g. 4. Add together all of the amounts to find the total grams of carbohydrates eaten: ? 30 g + 15 g + 15 g + 15 g = 75 g of carbohydrates total. This information is not intended to replace advice given to you by your health care provider. Make sure you discuss any questions you have with your health care provider. Document Released: 10/15/2005 Document Revised: 05/04/2016 Document Reviewed: 03/28/2016 Elsevier Interactive Patient Education  2018 [employer] Maintenance for Postmenopausal Women Menopause is a normal process in which your reproductive ability comes to an end. This process happens gradually over a span of months to years, usually between the ages of 3 and 52. Menopause is complete when you have missed 12 consecutive menstrual periods. It is important to talk with your health care provider about some of the most common conditions that affect postmenopausal women, such as heart disease, cancer, and bone loss (osteoporosis). Adopting a healthy lifestyle and getting preventive care can help to promote your health and wellness. Those actions can also lower your chances of developing some of these common conditions. What should I know about menopause? During menopause, you may experience a number of symptoms, such as:  Moderate-to-severe hot flashes.  Night sweats.  Decrease in sex drive.  Mood swings.  Headaches.  Tiredness.  Irritability.  Memory problems.  Insomnia.  Choosing to treat or not to treat menopausal changes is an individual decision that you make with your health care provider. What should I know about hormone replacement therapy and supplements? Hormone therapy products are effective for treating symptoms that are associated with menopause, such as hot flashes and night sweats. Hormone replacement carries certain risks, especially as you become older. If you are thinking  about using estrogen or estrogen with progestin treatments, discuss the benefits and risks with your health care provider. What should I know about heart disease and stroke? Heart disease, heart attack, and stroke become more likely as you age. This may be due, in part, to the hormonal changes that your body experiences during menopause. These can affect how your body processes dietary fats, triglycerides, and cholesterol. Heart attack and stroke are both medical emergencies. There are many things that you can do to help prevent heart disease and stroke:  Have your blood pressure checked at least every 1-2 years. High blood pressure causes heart disease and increases the risk of stroke.  If you are 58-101 years old, ask your health care provider if you should take aspirin to prevent a heart attack or a stroke.  Do not use any tobacco products, including cigarettes, chewing tobacco, or electronic cigarettes. If you need help quitting, ask your health care provider.  It is important to eat a healthy diet and maintain  a healthy weight. ? Be sure to include plenty of vegetables, fruits, low-fat dairy products, and lean protein. ? Avoid eating foods that are high in solid fats, added sugars, or salt (sodium).  Get regular exercise. This is one of the most important things that you can do for your health. ? Try to exercise for at least 150 minutes each week. The type of exercise that you do should increase your heart rate and make you sweat. This is known as moderate-intensity exercise. ? Try to do strengthening exercises at least twice each week. Do these in addition to the moderate-intensity exercise.  Know your numbers.Ask your health care provider to check your cholesterol and your blood glucose. Continue to have your blood tested as directed by your health care provider.  What should I know about cancer screening? There are several types of cancer. Take the following steps to reduce your risk  and to catch any cancer development as early as possible. Breast Cancer  Practice breast self-awareness. ? This means understanding how your breasts normally appear and feel. ? It also means doing regular breast self-exams. Let your health care provider know about any changes, no matter how small.  If you are 97 or older, have a clinician do a breast exam (clinical breast exam or CBE) every year. Depending on your age, family history, and medical history, it may be recommended that you also have a yearly breast X-ray (mammogram).  If you have a family history of breast cancer, talk with your health care provider about genetic screening.  If you are at high risk for breast cancer, talk with your health care provider about having an MRI and a mammogram every year.  Breast cancer (BRCA) gene test is recommended for women who have family members with BRCA-related cancers. Results of the assessment will determine the need for genetic counseling and BRCA1 and for BRCA2 testing. BRCA-related cancers include these types: ? Breast. This occurs in males or females. ? Ovarian. ? Tubal. This may also be called fallopian tube cancer. ? Cancer of the abdominal or pelvic lining (peritoneal cancer). ? Prostate. ? Pancreatic.  Cervical, Uterine, and Ovarian Cancer Your health care provider may recommend that you be screened regularly for cancer of the pelvic organs. These include your ovaries, uterus, and vagina. This screening involves a pelvic exam, which includes checking for microscopic changes to the surface of your cervix (Pap test).  For women ages 21-65, health care providers may recommend a pelvic exam and a Pap test every three years. For women ages 20-65, they may recommend the Pap test and pelvic exam, combined with testing for human papilloma virus (HPV), every five years. Some types of HPV increase your risk of cervical cancer. Testing for HPV may also be done on women of any age who have  unclear Pap test results.  Other health care providers may not recommend any screening for nonpregnant women who are considered low risk for pelvic cancer and have no symptoms. Ask your health care provider if a screening pelvic exam is right for you.  If you have had past treatment for cervical cancer or a condition that could lead to cancer, you need Pap tests and screening for cancer for at least 20 years after your treatment. If Pap tests have been discontinued for you, your risk factors (such as having a new sexual partner) need to be reassessed to determine if you should start having screenings again. Some women have medical problems that increase the chance  of getting cervical cancer. In these cases, your health care provider may recommend that you have screening and Pap tests more often.  If you have a family history of uterine cancer or ovarian cancer, talk with your health care provider about genetic screening.  If you have vaginal bleeding after reaching menopause, tell your health care provider.  There are currently no reliable tests available to screen for ovarian cancer.  Lung Cancer Lung cancer screening is recommended for adults 24-36 years old who are at high risk for lung cancer because of a history of smoking. A yearly low-dose CT scan of the lungs is recommended if you:  Currently smoke.  Have a history of at least 30 pack-years of smoking and you currently smoke or have quit within the past 15 years. A pack-year is smoking an average of one pack of cigarettes per day for one year.  Yearly screening should:  Continue until it has been 15 years since you quit.  Stop if you develop a health problem that would prevent you from having lung cancer treatment.  Colorectal Cancer  This type of cancer can be detected and can often be prevented.  Routine colorectal cancer screening usually begins at age 24 and continues through age 65.  If you have risk factors for colon  cancer, your health care provider may recommend that you be screened at an earlier age.  If you have a family history of colorectal cancer, talk with your health care provider about genetic screening.  Your health care provider may also recommend using home test kits to check for hidden blood in your stool.  A small camera at the end of a tube can be used to examine your colon directly (sigmoidoscopy or colonoscopy). This is done to check for the earliest forms of colorectal cancer.  Direct examination of the colon should be repeated every 5-10 years until age 58. However, if early forms of precancerous polyps or small growths are found or if you have a family history or genetic risk for colorectal cancer, you may need to be screened more often.  Skin Cancer  Check your skin from head to toe regularly.  Monitor any moles. Be sure to tell your health care provider: ? About any new moles or changes in moles, especially if there is a change in a mole's shape or color. ? If you have a mole that is larger than the size of a pencil eraser.  If any of your family members has a history of skin cancer, especially at a Stephano Arrants age, talk with your health care provider about genetic screening.  Always use sunscreen. Apply sunscreen liberally and repeatedly throughout the day.  Whenever you are outside, protect yourself by wearing long sleeves, pants, a wide-brimmed hat, and sunglasses.  What should I know about osteoporosis? Osteoporosis is a condition in which bone destruction happens more quickly than new bone creation. After menopause, you may be at an increased risk for osteoporosis. To help prevent osteoporosis or the bone fractures that can happen because of osteoporosis, the following is recommended:  If you are 61-30 years old, get at least 1,000 mg of calcium and at least 600 mg of vitamin D per day.  If you are older than age 26 but younger than age 94, get at least 1,200 mg of calcium and  at least 600 mg of vitamin D per day.  If you are older than age 26, get at least 1,200 mg of calcium and at least  800 mg of vitamin D per day.  Smoking and excessive alcohol intake increase the risk of osteoporosis. Eat foods that are rich in calcium and vitamin D, and do weight-bearing exercises several times each week as directed by your health care provider. What should I know about how menopause affects my mental health? Depression may occur at any age, but it is more common as you become older. Common symptoms of depression include:  Low or sad mood.  Changes in sleep patterns.  Changes in appetite or eating patterns.  Feeling an overall lack of motivation or enjoyment of activities that you previously enjoyed.  Frequent crying spells.  Talk with your health care provider if you think that you are experiencing depression. What should I know about immunizations? It is important that you get and maintain your immunizations. These include:  Tetanus, diphtheria, and pertussis (Tdap) booster vaccine.  Influenza every year before the flu season begins.  Pneumonia vaccine.  Shingles vaccine.  Your health care provider may also recommend other immunizations. This information is not intended to replace advice given to you by your health care provider. Make sure you discuss any questions you have with your health care provider. Document Released: 12/07/2005 Document Revised: 05/04/2016 Document Reviewed: 07/19/2015 Elsevier Interactive Patient Education  2018 [employer].

## 2017-04-29 NOTE — Progress Notes (Signed)
Tara Duffy 08/04/1961 [id number]    History:    Presents for annual exam.  Postmenopausal/no HRT/no bleeding. Occasional hot flushes. 2013 ablation. Normal Pap and mammogram history. Has put on 30 pounds in the past year, relates to stress. Currently on a weight loss program. Hoping to lose at least 40 pounds and is then planning a tummy tuck next year. Has had a consult with Dr. Freddi Starr. Reports all normal labs at weight loss clinic. Breakup with long-term partner has had counseling. Denies infidelity.  Past medical history, past surgical history, family history and social history were all reviewed and documented in the EPIC chart. Sells cardiac catheter lab equipment. Has 5 children all doing well, youngest is scheduled to graduate college in December.  ROS:  A ROS was performed and pertinent positives and negatives are included.  Exam:  Vitals:   04/29/17 0912  BP: 124/80  Weight: 235 lb (106.6 kg)  Height: 5\' 8"  (1.727 m)   Body mass index is 35.73 kg/m.   General appearance:  Normal Thyroid:  Symmetrical, normal in size, without palpable masses or nodularity. Respiratory  Auscultation:  Clear without wheezing or rhonchi Cardiovascular  Auscultation:  Regular rate, without rubs, murmurs or gallops  Edema/varicosities:  Not grossly evident Abdominal  Soft,nontender, without masses, guarding or rebound.  Liver/spleen:  No organomegaly noted  Hernia:  None appreciated  Skin  Inspection:  Grossly normal   Breasts: Examined lying and sitting.     Right: Without masses, retractions, discharge or axillary adenopathy.     Left: Without masses, retractions, discharge or axillary adenopathy. Gentitourinary   Inguinal/mons:  Normal without inguinal adenopathy  External genitalia:  Normal  BUS/Urethra/Skene's glands:  Normal  Vagina:  Normal  Cervix:  Normal  Uterus:   normal in size, shape and contour.  Midline and mobile  Adnexa/parametria:     Rt: Without masses or  tenderness.   Lt: Without masses or tenderness.  Anus and perineum: Normal  Digital rectal exam: Normal sphincter tone without palpated masses or tenderness  Assessment/Plan:  56 y.o. D WF G5 P5  for annual exam with no complaints.  Postmenopausal/no HRT/no bleeding Anxiety and depression stable on Zoloft Obesity Weight loss clinic-reports all normal screening labs  Plan: Keep scheduled follow-up with weight loss clinic, dietitian and weight loss plan. SBE's, continue annual screening mammogram has scheduled today. Encouraged regular exercise, calcium rich diet, vitamin D 2000 daily. Zoloft 100 mg by mouth daily prescription, proper use given and reviewed. Has not had a screening colonoscopy strongly encouraged, [facility] information given instructed to schedule. Pap with HR HPV typing, new screening guidelines reviewed, GC/Chlamydia, declines HIV, hepatitis or RPR.   Huel Cote [facility], 10:07 AM 04/29/2017

## 2017-04-29 NOTE — Addendum Note (Signed)
Addended by: Thurnell Garbe A on: 04/29/2017 10:52 AM   Modules accepted: Orders

## 2017-04-30 LAB — GC/CHLAMYDIA PROBE AMP
CT Probe RNA: NOT DETECTED
GC Probe RNA: NOT DETECTED

## 2017-05-02 LAB — PAP, TP IMAGING W/ HPV RNA, RFLX HPV TYPE 16,18/45: HPV mRNA, High Risk: NOT DETECTED

## 2017-12-01 ENCOUNTER — Other Ambulatory Visit: Payer: Self-pay | Admitting: Women's Health

## 2017-12-01 DIAGNOSIS — F419 Anxiety disorder, unspecified: Secondary | ICD-10-CM

## 2017-12-16 ENCOUNTER — Ambulatory Visit (INDEPENDENT_AMBULATORY_CARE_PROVIDER_SITE_OTHER): Payer: BLUE CROSS/BLUE SHIELD

## 2017-12-16 ENCOUNTER — Encounter: Payer: Self-pay | Admitting: Women's Health

## 2017-12-16 ENCOUNTER — Ambulatory Visit (INDEPENDENT_AMBULATORY_CARE_PROVIDER_SITE_OTHER): Payer: BLUE CROSS/BLUE SHIELD | Admitting: Women's Health

## 2017-12-16 VITALS — BP 128/80

## 2017-12-16 DIAGNOSIS — B373 Candidiasis of vulva and vagina: Secondary | ICD-10-CM | POA: Diagnosis not present

## 2017-12-16 DIAGNOSIS — B3731 Acute candidiasis of vulva and vagina: Secondary | ICD-10-CM

## 2017-12-16 DIAGNOSIS — R102 Pelvic and perineal pain: Secondary | ICD-10-CM | POA: Diagnosis not present

## 2017-12-16 DIAGNOSIS — B9689 Other specified bacterial agents as the cause of diseases classified elsewhere: Secondary | ICD-10-CM | POA: Diagnosis not present

## 2017-12-16 DIAGNOSIS — N760 Acute vaginitis: Secondary | ICD-10-CM | POA: Diagnosis not present

## 2017-12-16 LAB — WET PREP FOR TRICH, YEAST, CLUE

## 2017-12-16 MED ORDER — FLUCONAZOLE 150 MG PO TABS: 150.0000 mg | ORAL_TABLET | Freq: Once | ORAL | 1 refills | Status: AC

## 2017-12-16 MED ORDER — METRONIDAZOLE 500 MG PO TABS: 500.0000 mg | ORAL_TABLET | Freq: Two times a day (BID) | ORAL | 0 refills | Status: AC

## 2017-12-16 NOTE — Progress Notes (Signed)
58 year old M WF G4 P5 presents with complaint of bilateral groin discomfort, intermittent achy sensation  for the past month, increases with activity. Scant vaginal discharge without itching or odor. Denies urinary symptoms or fever.  Postmenopausal on no HRT with no bleeding.  Anxiety and depression stable on Zoloft. Having  severe left knee pain has follow-up scheduled, anticipating total knee in the next couple of months. Scheduled to move to [place] in June.  Exam: Appears well, no CVAT. Abdomen soft without rebound or radiation of discomfort, abdomen obese, skin excoriated at pannus. External genitalia within normal limits, speculum exam moderate amount of yellow discharge with odor noted, wet prep positive for yeast, clues, TNTC bacteria. Bimanual discomfort right adnexal, minimal left adnexal. Uterus small and mobile. Ultrasound: T/V uterus anteverted her heterogeneous,  Intramural fibroid 15 x 12 mm. Endometrium within normal limits, 3.6 mm. Right ovary normal. Left ovary thick-walled collapsed cyst 9 x 15 x 10 mm, negative: CFD. Negative cul-de-sac.  Yeast vaginitis bacterial vaginosis Left ovarian collapsed small cyst  Plan: Diflucan 150 by mouth times one dose with refill, Flagyl 500 twice daily for 7 days, alcohol precautions reviewed. Reviewed small fibroid most likely not causing discomfort, reviewed thick-walled cyst small, resolving. Instructed to call if continued problems. Encouraged to keep scheduled follow-up with orthopedist.

## 2018-03-04 ENCOUNTER — Other Ambulatory Visit: Payer: Self-pay | Admitting: Women's Health

## 2018-03-04 DIAGNOSIS — F419 Anxiety disorder, unspecified: Secondary | ICD-10-CM

## 2018-11-06 ENCOUNTER — Other Ambulatory Visit: Payer: Self-pay | Admitting: Women's Health

## 2018-11-06 DIAGNOSIS — F419 Anxiety disorder, unspecified: Secondary | ICD-10-CM

## 2019-01-13 ENCOUNTER — Other Ambulatory Visit: Payer: Self-pay | Admitting: Women's Health

## 2019-01-13 ENCOUNTER — Telehealth: Payer: Self-pay | Admitting: *Deleted

## 2019-01-13 DIAGNOSIS — F419 Anxiety disorder, unspecified: Secondary | ICD-10-CM

## 2019-01-13 MED ORDER — SERTRALINE HCL 100 MG PO TABS: 100.0000 mg | ORAL_TABLET | Freq: Every day | ORAL | 0 refills | Status: DC

## 2019-01-13 NOTE — Telephone Encounter (Signed)
Okay for refill, wish her luck in her new location

## 2019-01-13 NOTE — Telephone Encounter (Signed)
Pt now lives in [place], [place].  Last annual with [place] 04/2017 and a RG visit 11/2017.  She has tried to get in with a PCP there and the appt has been cancelled twice once in Jan 2020 and now 01/16/19 due to the [place] in that area.  Requests a 30 day refill on Zoloft as apt has now been rescheduled for April 2020. Please Advise KW CMA

## 2019-10-19 ENCOUNTER — Other Ambulatory Visit: Payer: Self-pay | Admitting: *Deleted

## 2019-10-19 DIAGNOSIS — F419 Anxiety disorder, unspecified: Secondary | ICD-10-CM
